# Patient Record
Sex: Male | Born: 1955 | Race: White
Health system: Northeastern US, Academic
[De-identification: ages and names within clinical notes are randomized; demographics above are authoritative.]

## PROBLEM LIST (undated history)

## (undated) DIAGNOSIS — K649 Unspecified hemorrhoids: Secondary | ICD-10-CM

## (undated) DIAGNOSIS — R519 Other chronic pain: Secondary | ICD-10-CM

## (undated) DIAGNOSIS — K219 Gastro-esophageal reflux disease without esophagitis: Secondary | ICD-10-CM

## (undated) DIAGNOSIS — M519 Unspecified thoracic, thoracolumbar and lumbosacral intervertebral disc disorder: Secondary | ICD-10-CM

## (undated) DIAGNOSIS — G8929 Other chronic pain: Secondary | ICD-10-CM

## (undated) DIAGNOSIS — I10 Essential (primary) hypertension: Secondary | ICD-10-CM

## (undated) HISTORY — PX: LUMBAR LAMINECTOMY: SHX95

## (undated) HISTORY — DX: Gastro-esophageal reflux disease without esophagitis: K21.9

## (undated) HISTORY — DX: Unspecified hemorrhoids: K64.9

## (undated) HISTORY — PX: TONSILLECTOMY: SHX28A

## (undated) HISTORY — DX: Essential (primary) hypertension: I10

## (undated) HISTORY — DX: Other chronic pain: R51.9

---

## 2018-11-19 IMAGING — CT CT THORACIC SPINE WO CONTRAST
3 series · 8 of 14 positions shown, 9 images · non-contrast
Comparison: none

CT examination thoracic spine.
Source images as well as 2-D sagittal coronal reconstruction images are obtained.
There is lucency compatible with fracture lines demonstrated extending horizontally as well as vertically within the cephalad aspect of the L1 vertebral body. There is approximately 10% loss of ventral vertebral body height. There is mild, 2 mm, retropulsion cephalad aspect of T12.
Endplate hypertrophy is demonstrated T7-T8, T8-T9, T9-T10, T10-T11 compatible with degenerative disc disease reactive changes. No fractures demonstrated visualized portions of the ribs.
Visualized portions of lungs are clear and there is no pleural effusion.
Paraspinal musculature appears unremarkable.

[Series 2: t-spine stnd · axial · 0.34mm/px · z∈[-250,-125]mm · 2 of 150 slices shown]
[im 50/150  soft-tissue]
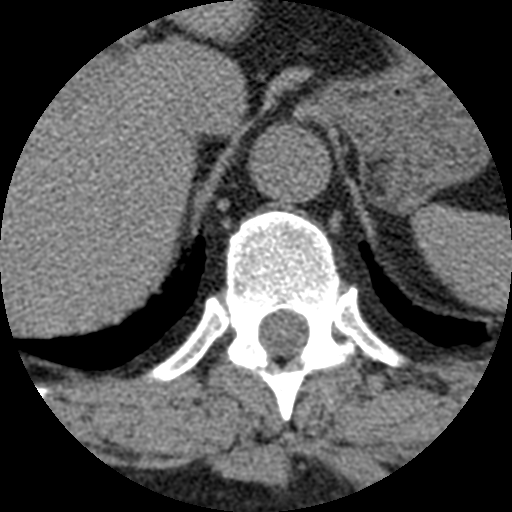
[im 100/150  soft-tissue]
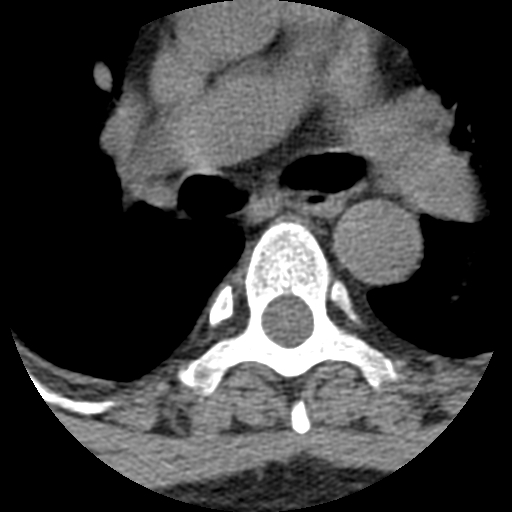

[Series 601: cor 1 · coronal · 0.73mm/px · 3 of 140 slices shown, 4 images]
[im 35/140  soft-tissue]
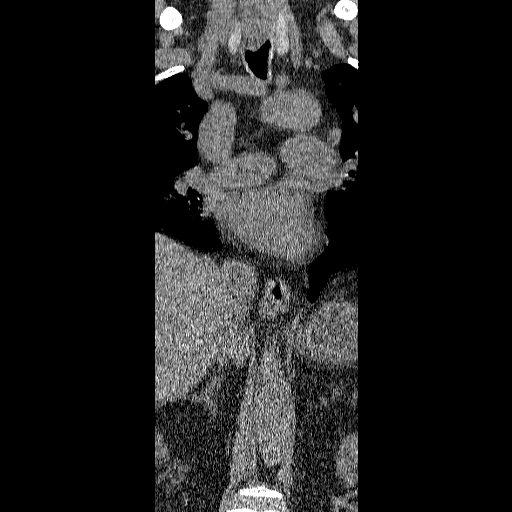
[im 35/140  bone]
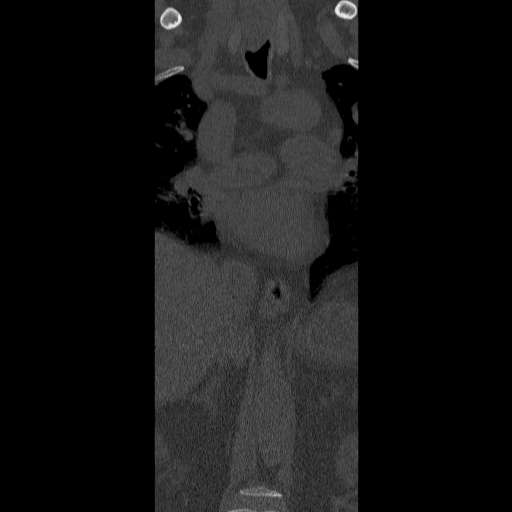
[im 70/140  bone]
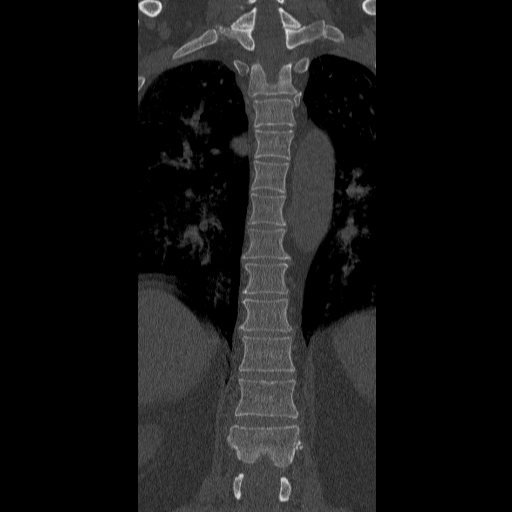
[im 105/140  bone]
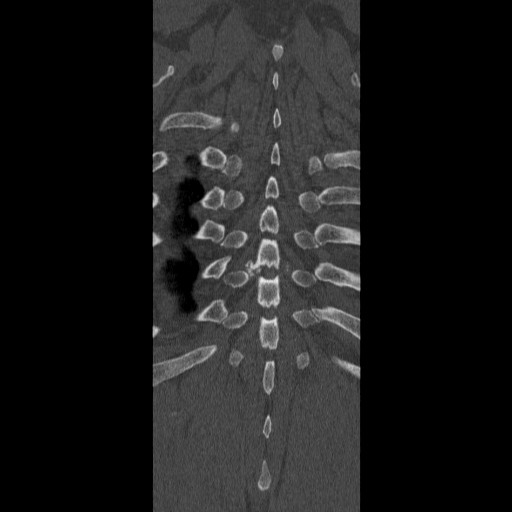

[Series 602: sag 1 · sagittal · 0.73mm/px · 3 of 140 slices shown]
[im 35/140  bone]
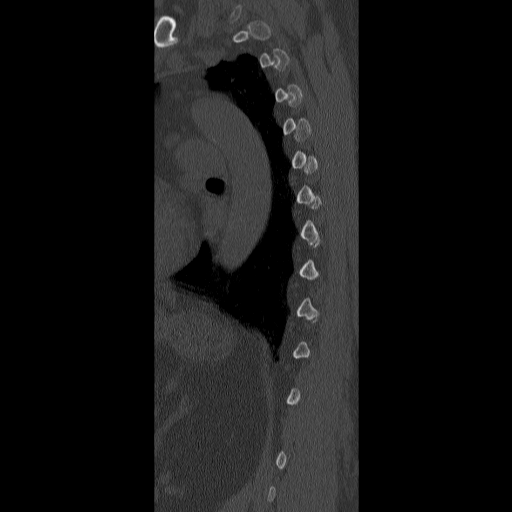
[im 70/140  bone]
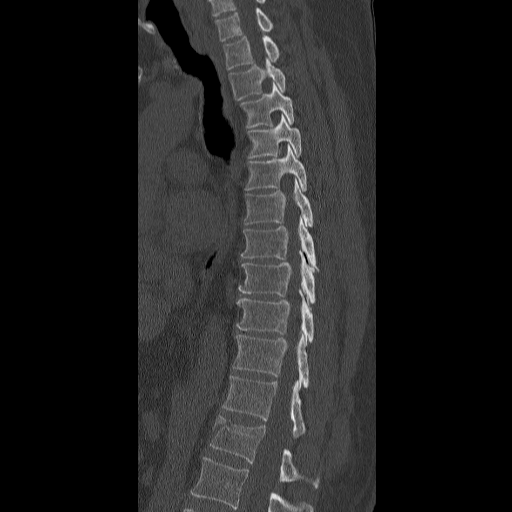
[im 105/140  bone]
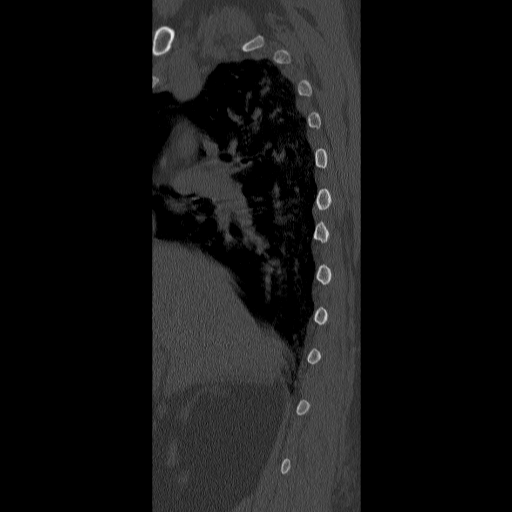

[8 of 14 positions shown; findings below may reference images not displayed]

IMPRESSION: Burst fracture involving cephalad aspect of the L1 vertebral body. There is very mild, 2 mm, retropulsion and no resultant significant canal narrowing and there is approximately 10% loss of vertebral body height.
Location 9

## 2018-11-19 IMAGING — CT CT CERVICAL SPINE WO CONTRAST
2 of 3 series · 8 of 14 positions shown, 9 images · non-contrast
Comparison: none

CT cervical spine examination.
Source images as well as 2-D sagittal coronal reconstruction images are obtained.
There is some mild diffuse kyphotic curvature of the C2-C7 vertebral body levels. Vertebral body heights are maintained. There is endplate hypertrophy at the C4-C5 C5-C6 and C6-C7. There is no fracture demonstrated of cervical vertebral bodies, or posterior elements. A supracondylar as are intact. Visualized lung apices are clear. Visualized portions are ribs appear intact. Prevertebral soft tissues are normal in thickness. Airway shows normal contour normal caliber.

[Series 601: cor thins · coronal · 0.38mm/px · 4 of 194 slices shown, 5 images]
[im 39/194  soft-tissue]
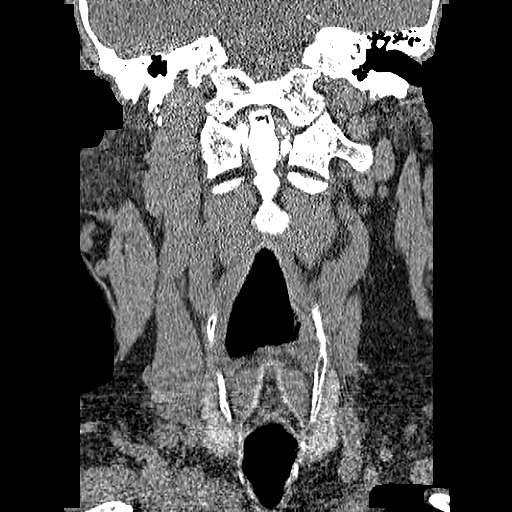
[im 39/194  bone]
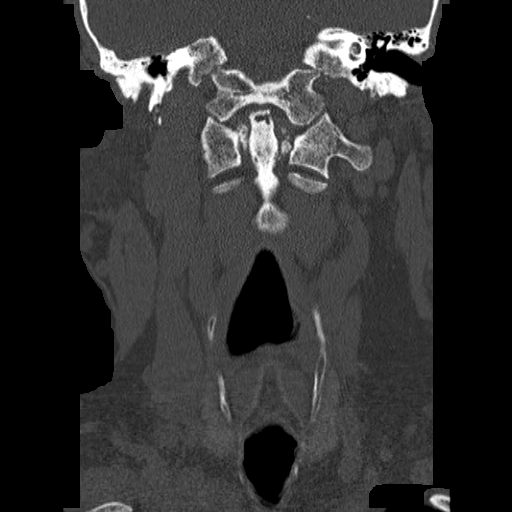
[im 78/194  bone]
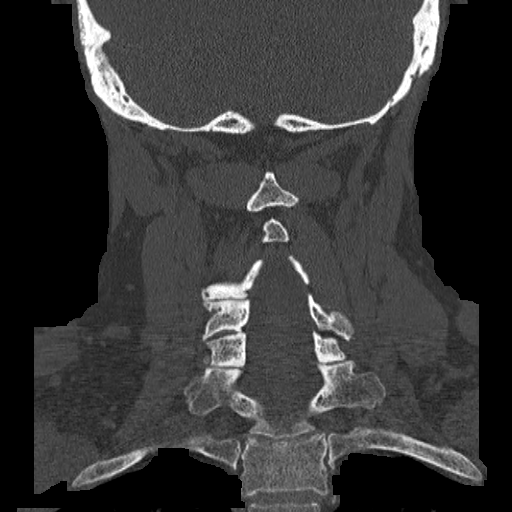
[im 116/194  bone]
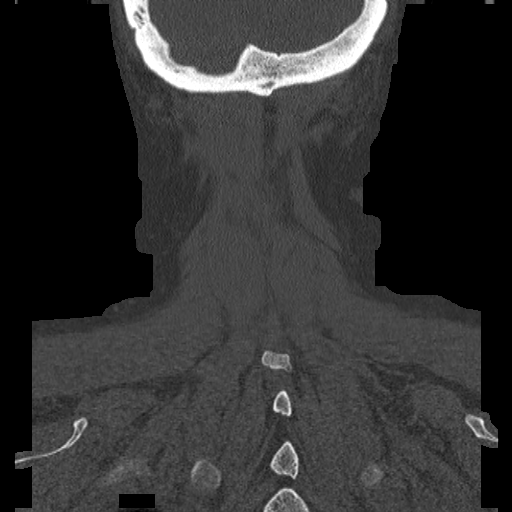
[im 155/194  bone]
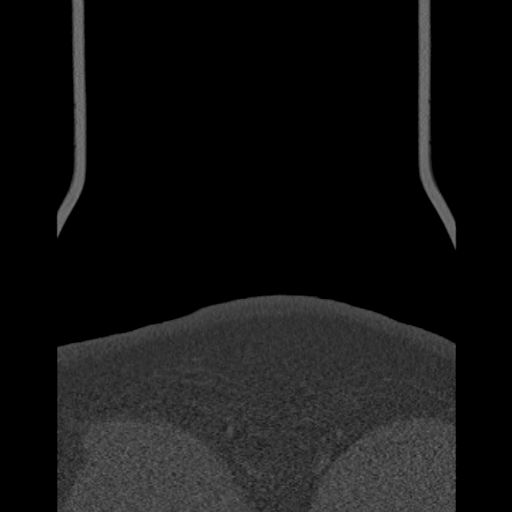

[Series 602: sag thins · sagittal · 0.38mm/px · 4 of 195 slices shown]
[im 39/195  bone]
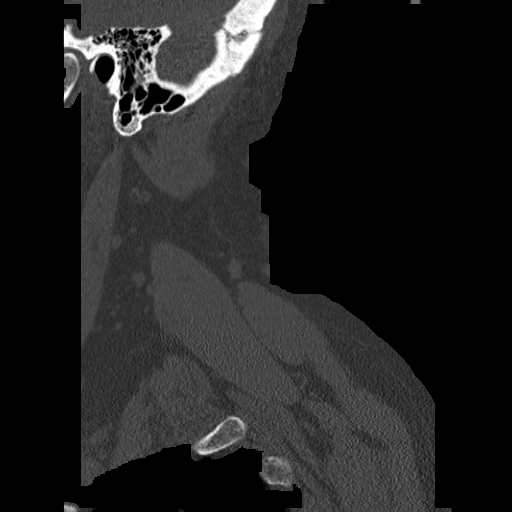
[im 78/195  bone]
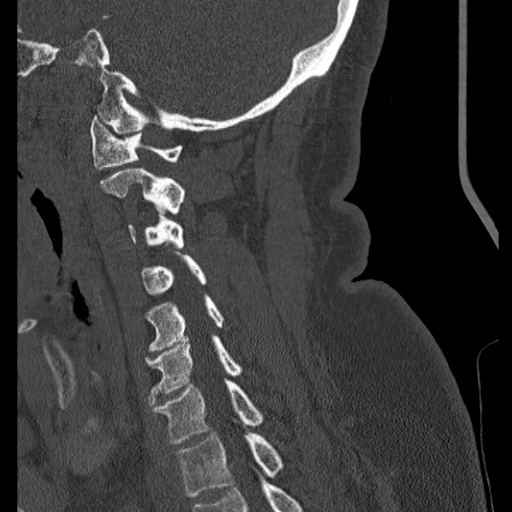
[im 117/195  bone]
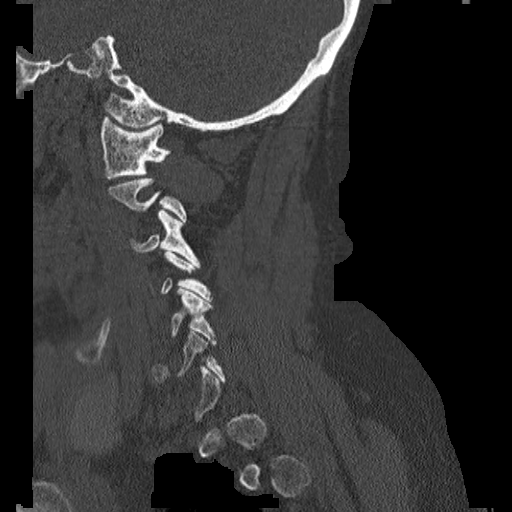
[im 156/195  bone]
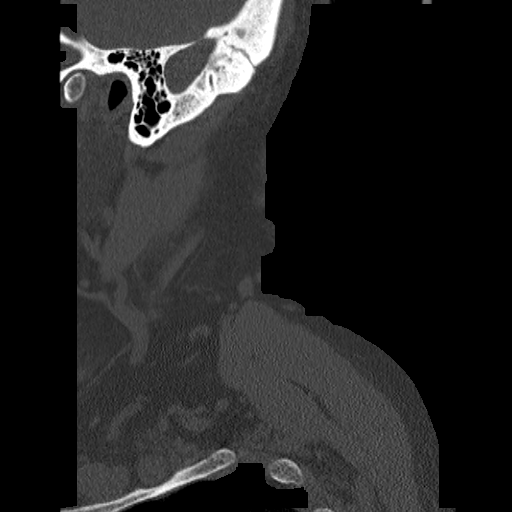

[8 of 14 positions shown; findings below may reference images not displayed]

IMPRESSION: Degenerative disc disease C4-C5 and C5-C6.
No acute osseous injury is demonstrated of cervical spine.
Mild diffuse kyphotic curvature which may represent muscle spasm.
Location 9.

## 2020-08-13 ENCOUNTER — Encounter: Payer: Self-pay | Admitting: Plastic Surgery

## 2020-08-13 ENCOUNTER — Inpatient Hospital Stay
Admission: EM | Admit: 2020-08-13 | Discharge: 2020-08-15 | DRG: 311 | Disposition: A | Discharge status: Home / Self Care | Payer: Medicare Other | Source: Ambulatory Visit | Attending: Emergency Medicine | Admitting: Emergency Medicine

## 2020-08-13 ENCOUNTER — Emergency Department: Payer: Medicare Other

## 2020-08-13 DIAGNOSIS — Z20822 Contact with and (suspected) exposure to covid-19: Secondary | ICD-10-CM | POA: Diagnosis present

## 2020-08-13 DIAGNOSIS — M519 Unspecified thoracic, thoracolumbar and lumbosacral intervertebral disc disorder: Secondary | ICD-10-CM

## 2020-08-13 DIAGNOSIS — R002 Palpitations: Secondary | ICD-10-CM

## 2020-08-13 DIAGNOSIS — R0789 Other chest pain: Secondary | ICD-10-CM

## 2020-08-13 DIAGNOSIS — R918 Other nonspecific abnormal finding of lung field: Secondary | ICD-10-CM

## 2020-08-13 DIAGNOSIS — K5900 Constipation, unspecified: Secondary | ICD-10-CM | POA: Diagnosis present

## 2020-08-13 DIAGNOSIS — K279 Peptic ulcer, site unspecified, unspecified as acute or chronic, without hemorrhage or perforation: Secondary | ICD-10-CM | POA: Diagnosis present

## 2020-08-13 DIAGNOSIS — I160 Hypertensive urgency: Secondary | ICD-10-CM | POA: Diagnosis present

## 2020-08-13 DIAGNOSIS — K219 Gastro-esophageal reflux disease without esophagitis: Secondary | ICD-10-CM | POA: Diagnosis present

## 2020-08-13 DIAGNOSIS — I080 Rheumatic disorders of both mitral and aortic valves: Secondary | ICD-10-CM | POA: Diagnosis present

## 2020-08-13 DIAGNOSIS — Z87891 Personal history of nicotine dependence: Secondary | ICD-10-CM

## 2020-08-13 DIAGNOSIS — E785 Hyperlipidemia, unspecified: Secondary | ICD-10-CM | POA: Diagnosis present

## 2020-08-13 DIAGNOSIS — R9431 Abnormal electrocardiogram [ECG] [EKG]: Secondary | ICD-10-CM | POA: Diagnosis present

## 2020-08-13 DIAGNOSIS — R079 Chest pain, unspecified: Secondary | ICD-10-CM

## 2020-08-13 DIAGNOSIS — I209 Angina pectoris, unspecified: Principal | ICD-10-CM | POA: Diagnosis present

## 2020-08-13 DIAGNOSIS — I10 Essential (primary) hypertension: Secondary | ICD-10-CM | POA: Diagnosis present

## 2020-08-13 HISTORY — DX: Other chronic pain: G89.29

## 2020-08-13 HISTORY — DX: Unspecified thoracic, thoracolumbar and lumbosacral intervertebral disc disorder: M51.9

## 2020-08-13 LAB — RUQ PANEL (ED ONLY)
ALT: 36 U/L (ref 0–50)
ALT: 31 U/L (ref 0–50)
AST: 29 U/L (ref 0–50)
AST: 27 U/L (ref 0–50)
Albumin: 4.5 g/dL (ref 3.5–5.2)
Albumin: 4.6 g/dL (ref 3.5–5.2)
Alk Phos: 78 U/L (ref 40–130)
Alk Phos: 77 U/L (ref 40–130)
Amylase: 40 U/L (ref 28–100)
Amylase: 40 U/L (ref 28–100)
Bilirubin,Direct: <0.2 mg/dL (ref 0.0–0.3)
Bilirubin,Direct: <0.2 mg/dL (ref 0.0–0.3)
Bilirubin,Total: 0.4 mg/dL (ref 0.0–1.2)
Bilirubin,Total: 0.3 mg/dL (ref 0.0–1.2)
Lipase: 39 U/L (ref 13–60)
Lipase: 34 U/L (ref 13–60)
Total Protein: 6.9 g/dL (ref 6.3–7.7)
Total Protein: 6.8 g/dL (ref 6.3–7.7)

## 2020-08-13 LAB — TROPONIN T 3 HR W/ DELTA HIGH SENSITIVITY (IP/ED ONLY)
HS TROP % Change: -20 % — ABNORMAL LOW (ref 0–20)
TROP T 0-3 HR DELTA High Sensitivity: -2 — ABNORMAL LOW (ref 0–11)
TROP T 3 HR High Sensitivity: 8 ng/L (ref 0–21)

## 2020-08-13 LAB — CBC AND DIFFERENTIAL
Baso # K/uL: 0.1 10*3/uL (ref 0.0–0.1)
Baso # K/uL: 0.1 10*3/uL (ref 0.0–0.1)
Basophil %: 1 %
Basophil %: 1 %
Eos # K/uL: 0.3 10*3/uL (ref 0.0–0.5)
Eos # K/uL: 0.3 10*3/uL (ref 0.0–0.5)
Eosinophil %: 4.7 %
Eosinophil %: 4.9 %
Hematocrit: 47 % (ref 40–51)
Hematocrit: 48 % (ref 40–51)
Hemoglobin: 15.8 g/dL (ref 13.7–17.5)
Hemoglobin: 15.6 g/dL (ref 13.7–17.5)
IMM Granulocytes #: 0 10*3/uL (ref 0.0–0.0)
IMM Granulocytes #: 0 10*3/uL (ref 0.0–0.0)
IMM Granulocytes: 0.2 %
IMM Granulocytes: 0.2 %
Lymph # K/uL: 1.8 10*3/uL (ref 1.3–3.6)
Lymph # K/uL: 1.6 10*3/uL (ref 1.3–3.6)
Lymphocyte %: 28.7 %
Lymphocyte %: 27.3 %
MCH: 31 pg (ref 26–32)
MCH: 32 pg (ref 26–32)
MCHC: 33 g/dL (ref 32–37)
MCHC: 33 g/dL (ref 32–37)
MCV: 94 fL — ABNORMAL HIGH (ref 79–92)
MCV: 96 fL — ABNORMAL HIGH (ref 79–92)
Mono # K/uL: 0.5 10*3/uL (ref 0.3–0.8)
Mono # K/uL: 0.5 10*3/uL (ref 0.3–0.8)
Monocyte %: 8.5 %
Monocyte %: 8.1 %
Neut # K/uL: 3.5 10*3/uL (ref 1.8–5.4)
Neut # K/uL: 3.5 10*3/uL (ref 1.8–5.4)
Nucl RBC # K/uL: 0 10*3/uL (ref 0.0–0.0)
Nucl RBC # K/uL: 0 10*3/uL (ref 0.0–0.0)
Nucl RBC %: 0 /100 WBC (ref 0.0–0.2)
Nucl RBC %: 0 /100 WBC (ref 0.0–0.2)
Platelets: 232 10*3/uL (ref 150–330)
Platelets: 228 10*3/uL (ref 150–330)
RBC: 5.1 MIL/uL (ref 4.6–6.1)
RBC: 5 MIL/uL (ref 4.6–6.1)
RDW: 12.6 % (ref 11.6–14.4)
RDW: 12.5 % (ref 11.6–14.4)
Seg Neut %: 56.9 %
Seg Neut %: 58.5 %
WBC: 6.1 10*3/uL (ref 4.2–9.1)
WBC: 6 10*3/uL (ref 4.2–9.1)

## 2020-08-13 LAB — BASIC METABOLIC PANEL
Anion Gap: 14 (ref 7–16)
Anion Gap: 13 (ref 7–16)
CO2: 23 mmol/L (ref 20–28)
CO2: 23 mmol/L (ref 20–28)
Calcium: 9.6 mg/dL (ref 8.6–10.2)
Calcium: 9.2 mg/dL (ref 8.6–10.2)
Chloride: 102 mmol/L (ref 96–108)
Chloride: 105 mmol/L (ref 96–108)
Creatinine: 1.16 mg/dL (ref 0.67–1.17)
Creatinine: 1.18 mg/dL — ABNORMAL HIGH (ref 0.67–1.17)
GFR,Black: 75 *
GFR,Black: 74 *
GFR,Caucasian: 65 *
GFR,Caucasian: 64 *
Glucose: 122 mg/dL — ABNORMAL HIGH (ref 60–99)
Glucose: 122 mg/dL — ABNORMAL HIGH (ref 60–99)
Lab: 23 mg/dL — ABNORMAL HIGH (ref 6–20)
Lab: 24 mg/dL — ABNORMAL HIGH (ref 6–20)
Potassium: 4.6 mmol/L (ref 3.3–5.1)
Potassium: 4 mmol/L (ref 3.3–5.1)
Sodium: 139 mmol/L (ref 133–145)
Sodium: 141 mmol/L (ref 133–145)

## 2020-08-13 LAB — HOLD BLUE

## 2020-08-13 LAB — COVID-19 NAAT (PCR): COVID-19 NAAT (PCR): NEGATIVE

## 2020-08-13 LAB — DATE/TIME NOT PROVIDED

## 2020-08-13 LAB — PERFORMING LAB

## 2020-08-13 LAB — HOLD GREEN NO GEL

## 2020-08-13 LAB — TROPONIN T 0 HR HIGH SENSITIVITY (IP/ED ONLY): TROP T 0 HR High Sensitivity: 10 ng/L (ref 0–21)

## 2020-08-13 LAB — COVID-19 PCR

## 2020-08-13 MED ORDER — PANTOPRAZOLE SODIUM 40 MG PO TBEC *I*
40.0000 mg | DELAYED_RELEASE_TABLET | Freq: Once | ORAL | Status: AC
Start: 2020-08-13 — End: 2020-08-13
  Administered 2020-08-13: 40 mg via ORAL
  Filled 2020-08-13: qty 1

## 2020-08-13 MED ORDER — ASPIRIN 81 MG PO CHEW *I*
324.0000 mg | CHEWABLE_TABLET | Freq: Once | ORAL | Status: AC
Start: 2020-08-13 — End: 2020-08-13
  Administered 2020-08-13: 324 mg via ORAL

## 2020-08-13 MED ORDER — DOCUSATE SODIUM 100 MG PO CAPS *I*
100.0000 mg | ORAL_CAPSULE | Freq: Two times a day (BID) | ORAL | Status: DC
Start: 2020-08-13 — End: 2020-08-15
  Administered 2020-08-13 – 2020-08-15 (×4): 100 mg via ORAL
  Filled 2020-08-13 (×4): qty 1

## 2020-08-13 MED ORDER — ASPIRIN 325 MG PO TBEC *I*
325.0000 mg | DELAYED_RELEASE_TABLET | Freq: Every day | ORAL | Status: DC
Start: 2020-08-14 — End: 2020-08-15
  Administered 2020-08-14 – 2020-08-15 (×2): 325 mg via ORAL
  Filled 2020-08-13 (×2): qty 1

## 2020-08-13 MED ORDER — ASPIRIN 325 MG PO TABS *I*
ORAL_TABLET | ORAL | Status: DC
Start: 2020-08-13 — End: 2020-08-13
  Filled 2020-08-13: qty 1

## 2020-08-13 MED ORDER — SODIUM CHLORIDE 0.9 % FLUSH FOR PUMPS *I*
0.0000 mL/h | INTRAVENOUS | Status: DC | PRN
Start: 2020-08-13 — End: 2020-08-15

## 2020-08-13 MED ORDER — ACETAMINOPHEN 500 MG PO TABS *I*
1000.0000 mg | ORAL_TABLET | Freq: Four times a day (QID) | ORAL | Status: DC | PRN
Start: 2020-08-13 — End: 2020-08-15
  Administered 2020-08-15: 1000 mg via ORAL
  Filled 2020-08-13: qty 2

## 2020-08-13 MED ORDER — FAMOTIDINE 20 MG PO TABS *I*
20.0000 mg | ORAL_TABLET | Freq: Once | ORAL | Status: AC
Start: 2020-08-13 — End: 2020-08-13
  Administered 2020-08-13: 20 mg via ORAL
  Filled 2020-08-13: qty 1

## 2020-08-13 NOTE — First Provider Contact (Signed)
ED First Provider Contact Note    Patient with chest pain, radiating to back wit indigestion, pain comes and goes, exertions make pain symptoms worse.  Also, brother in law is paraplegic, pushes his up/down driveway, last did this 2-3 weeks ago, denies injury  Vital signs reviewed.    Assessment: CP/back pain    Orders placed:  EKG, LABS, XRAYS and saline lock, telemetry     Patient requires further evaluation.     Vienne Corcoran ANN Clint, NP, 08/13/2020, 11:00 AM     Gale Journey, Audrea Muscat, NP  08/13/20 1103

## 2020-08-13 NOTE — ED Notes (Signed)
Plan of Care     Meds per [facility]

## 2020-08-13 NOTE — ED Notes (Signed)
Plan of Care     VS, safety and comfort measures, NPO, stress echo, tele, cards

## 2020-08-13 NOTE — ED Triage Notes (Signed)
Chest pain that radiates to the back for 3 days. Pain intermittent. Worse on exertion. No Cardiac Hx. EKG in triage. Hypertensive on arrival.             Prehospital medications given: No

## 2020-08-13 NOTE — ED Notes (Addendum)
Assumed care and assessed ATT. Resting comfortably in NAD. Pt denies CP, SOB, dizziness, nausea, palpitations. In NSR/sinus brady on tele. Reviewed POC, call bell within reach.

## 2020-08-13 NOTE — ED Provider Notes (Addendum)
History     Chief Complaint   Patient presents with    Chest Pain     Nicholas Page is a 64 yo M with minimal health care presenting with acute on chronic chest pain. He describes the pain as substernal pressure, somewhere between sharp and dull, that radiates to the back. He has had this chest pain for several years, but previously it was only with exertion. Over time, it presented with less and less exertion. For the past four days, he notes the chest pain has been constant. This has been associated with indigestion for the past week that has been worse than the indigestion he has had previously. He quit smoking in April, but has a 60 pack year smoking history. He denies pain radiating to the neck, tearing pain, diaphoresis, shortness of breath, orthopnea. He endorses occasional palpitations. He reports not seeing a doctor in 30 years because he has had poor health insurance coverage.     He denies any medical problems and takes only exedrin migraine for overall aches/pains and a stool softener. He endorses prior surgeries in his back.          Medical/Surgical/Family History     No past medical history on file.     There is no problem list on file for this patient.           No past surgical history on file.  No family history on file.       Social History     Tobacco Use    Smoking status: Not on file    Smokeless tobacco: Not on file   Substance Use Topics    Alcohol use: Not on file    Drug use: Not on file     Living Situation     Questions Responses    Patient lives with     Homeless     Caregiver for other family member     External Services     Employment     Domestic Violence Risk                 Review of Systems   Review of Systems   Constitutional: Negative for chills, diaphoresis and fever.   HENT: Negative for nosebleeds.    Eyes: Negative for pain.   Respiratory: Negative for cough and shortness of breath.    Cardiovascular: Positive for chest pain and palpitations.   Gastrointestinal:  Negative for abdominal pain, blood in stool, nausea and vomiting.   Genitourinary: Negative for hematuria.   Musculoskeletal: Negative for arthralgias.   Skin: Negative for rash.   Neurological: Negative for dizziness and headaches.   Hematological: Does not bruise/bleed easily.       Physical Exam     Triage Vitals  Triage Start: Start, (08/13/20 1056)   First Recorded BP: (!) 202/99, Resp: 18, Temp: 36.3 C (97.3 F) Oxygen Therapy SpO2: 98 %, O2 Device: None (Room air), Heart Rate: 70, (08/13/20 1056)  .      Physical Exam  Constitutional:       General: He is not in acute distress.     Appearance: He is not ill-appearing or diaphoretic.   HENT:      Head: Atraumatic.   Eyes:      Extraocular Movements: Extraocular movements intact.   Cardiovascular:      Rate and Rhythm: Normal rate.      Heart sounds: Murmur heard.    Systolic murmur is present with a  grade of 3/6.      Pulmonary:      Effort: Pulmonary effort is normal.      Breath sounds: Normal breath sounds.   Abdominal:      General: Bowel sounds are normal.      Palpations: Abdomen is soft.   Musculoskeletal:      Right lower leg: No edema.      Left lower leg: 1+ Edema present.   Skin:     General: Skin is warm and dry.   Neurological:      Mental Status: He is alert and oriented to person, place, and time.         Medical Decision Making     Assessment:  Nicholas Page is a  64 yo M presenting with acute worsening of chronic, exertional chest pain in the setting of hypertensive urgency and no health care contact for the past thirty years. Exam is notable for systolic murmur, likely aortic stenosis. EKG is reassuring for no acute or chronic changes. His presentation is most concerning for ACS, unstable angina v. NSTEMI. His back pain and hypertension are concerning for aortic dissection, although his description of symptom onset ( > 3 years with escalating sx) makes this much less likely.    Differential diagnosis:  Unstable angina, NSTEMI, aortic  dissection, esophageal spasm    Plan:  ASA 324mg   Troponin  CBC, BMP, RUQ panel  CXR  COVID PCR      EKG Interpretation: normal sinus rhythm, no ischemic changes    ED Course and Disposition:  Ed Steichen's CXR did not show mediastinal widening. Labs were reviewed and notable for non elevated 0 hour troponin at 10. He was admitted to ED observation for further observation and workup.             Ranelle Oyster, MS4      Med Student Attestation:    The medical student was personally supervised by me and/or my resident during the patient examination on 08/13/2020. I personally saw and evaluated the patient, provided the medical decision-making, and reviewed and verified the key elements of the student documentation. I have edited the medical student's note and confirm the findings and plan of care as documented.      Author:  Nicholaus Corolla, MD,PhD       Ranelle Oyster  08/13/20 [place], Laureano Hetzer, MD,PhD  08/14/20 1431

## 2020-08-13 NOTE — ED Notes (Signed)
Patient arrived to unit via stretcher. Patient ambulated to bed independently. Pt is Alert/Oriented x4. Pt oriented to unit and call bell. Pt made aware of observation level of care. Pt denies any SOB, CP, nausea, or dizziness at this time.

## 2020-08-13 NOTE — ED Obs Notes (Addendum)
ED OBSERVATION ADMISSION NOTE    Patient seen by me today, 08/13/2020 at 3:04 PM    Current patient status: Observation    History     Chief Complaint   Patient presents with    Chest Pain     Pt with a PMH of lumbar disc disease s/p lumbar laminectomy x 2, constipation and issues with headaches for which he takes about 3 Excedrin Migraine daily who presents with issues of chest and epigastric pain that has been an issue for about 3 yrs. Initially he would note this pain whenever he was active and he was ok at rest. More recently, particularly over the past weekend he has noted that the pain will occur at rest  As well as with activity. He notes the pain as being burning in nature with radiation into his back. He notes "indigestion" with it - in the form of burping a lot. He denies any nausea or shortness of breath. The pain is made worse with physical activity. Nothing in particular seems to relieve it though the indigestion gets better with adjustment of diet.     He denies any PMH of CAD. He has not seen a provider regularly in about 20 to 30 yrs so unk as to if he has baseline HTN or HLD. He was a heavy smoker for years but stopped in the spring of this year. He does drink about one beer / night and as noted above takes about 3 excedrin migraine per day for headaches and it also helps with his DJD. He does not use any type of street drugs. He has never had any evaluation by GI including never having had a screening colonoscopy.       History provided by:  Patient  Language interpreter used: No        Past Medical History:   Diagnosis Date    Chronic headaches     Lumbar disc disease        Past Surgical History:   Procedure Laterality Date    LUMBAR LAMINECTOMY      TONSILLECTOMY         Family History   Problem Relation Age of Onset    Cancer Sister        Social History      reports that he quit smoking about 7 months ago. He has never used smokeless tobacco. He reports current alcohol use of about 7.0  standard drinks of alcohol per week. He reports that he does not use drugs. No history on file for sexual activity.    Living Situation     Questions Responses    Patient lives with Family    Homeless No    Caregiver for other family member No    External Services     Employment Retired    Domestic Violence Risk No          Review of Systems   Review of Systems   Constitutional: Negative for activity change.   HENT: Negative for trouble swallowing.    Eyes: Negative for visual disturbance (chronic asigmatism ).   Respiratory: Negative for shortness of breath.    Cardiovascular: Positive for chest pain.   Gastrointestinal: Positive for constipation. Negative for nausea.   Endocrine: Negative for polyuria.   Genitourinary: Negative for difficulty urinating.   Musculoskeletal: Positive for arthralgias.   Skin: Negative for rash.   Allergic/Immunologic: Negative for immunocompromised state.   Neurological: Positive for weakness (chronically in left foot  /  callf from pervious lumbar surgery ).       Physical Exam   BP (!) 202/99    Pulse 70    Temp 36.3 C (97.3 F)    Resp 18    Ht 1.803 m (_0 )    Wt 120.2 kg (265 lb)    SpO2 98%    BMI 36.96 kg/m     Physical Exam  Vitals and nursing note reviewed.   Constitutional:       Appearance: He is well-developed.   HENT:      Head: Normocephalic.   Cardiovascular:      Rate and Rhythm: Normal rate.      Heart sounds:    No systolic murmur is present.   No diastolic murmur is present.      Pulmonary:      Breath sounds: Normal breath sounds. No decreased breath sounds, wheezing, rhonchi or rales.   Abdominal:      Palpations: Abdomen is soft.      Tenderness: There is no abdominal tenderness.   Musculoskeletal:      Right lower leg: No edema.      Left lower leg: No edema.      Comments: Chronic atrophy in left calf accompanied by weakness in left foot plantarflexion which pt reports is from his previous lumbar surgery with subsequent nerve damage.    Skin:     General:  Skin is warm.   Neurological:      Mental Status: He is alert and oriented to person, place, and time.   Psychiatric:         Mood and Affect: Mood normal. Mood is not anxious.         Behavior: Behavior normal. Behavior is not agitated.         Tests    EKG: sinus rate 67 / no acute findings   Labs:   All labs in the last 24 hours:   Recent Results (from the past 24 hour(s))   Troponin T 0 HR High Sensitivity    Collection Time: 08/13/20  1:08 PM   Result Value Ref Range    TROP T 0 HR High Sensitivity 10 0 - 21 ng/L   RUQ panel (ED only)    Collection Time: 08/13/20  1:08 PM   Result Value Ref Range    Amylase 40 28 - 100 U/L    Lipase 39 13 - 60 U/L    Total Protein 6.9 6.3 - 7.7 g/dL    Albumin 4.5 3.5 - 5.2 g/dL    Bilirubin,Total 0.4 0.0 - 1.2 mg/dL    Bili,Indirect see below 0.1 - 1.0 mg/dL    Bilirubin,Direct <0.2 0.0 - 0.3 mg/dL    Alk Phos 78 40 - 130 U/L    AST 29 0 - 50 U/L    ALT 36 0 - 50 U/L   CBC and differential    Collection Time: 08/13/20  1:08 PM   Result Value Ref Range    WBC 6.1 4.2 - 9.1 THOU/uL    RBC 5.1 4.6 - 6.1 MIL/uL    Hemoglobin 15.8 13.7 - 17.5 g/dL    Hematocrit 47 40 - 51 %    MCV 94 (H) 79 - 92 fL    MCH 31 26 - 32 pg    MCHC 33 32 - 37 g/dL    RDW 12.6 11.6 - 14.4 %    Platelets 232 150 - 330 THOU/uL  Seg Neut % 56.9 %    Lymphocyte % 28.7 %    Monocyte % 8.5 %    Eosinophil % 4.7 %    Basophil % 1.0 %    Neut # K/uL 3.5 1.8 - 5.4 THOU/uL    Lymph # K/uL 1.8 1.3 - 3.6 THOU/uL    Mono # K/uL 0.5 0.3 - 0.8 THOU/uL    Eos # K/uL 0.3 0.0 - 0.5 THOU/uL    Baso # K/uL 0.1 0.0 - 0.1 THOU/uL    Nucl RBC % 0.0 0.0 - 0.2 /100 WBC    Nucl RBC # K/uL 0.0 0.0 - 0.0 THOU/uL    IMM Granulocytes # 0.0 0.0 - 0.0 THOU/uL    IMM Granulocytes 0.2 %   Basic metabolic panel    Collection Time: 08/13/20  1:08 PM   Result Value Ref Range    Glucose 122 (H) 60 - 99 mg/dL    Sodium 139 133 - 145 mmol/L    Potassium 4.6 3.3 - 5.1 mmol/L    Chloride 102 96 - 108 mmol/L    CO2 23 20 - 28 mmol/L    Anion  Gap 14 7 - 16    UN 23 (H) 6 - 20 mg/dL    Creatinine 1.16 0.67 - 1.17 mg/dL    GFR,Caucasian 65 *    GFR,Black 75 *    Calcium 9.6 8.6 - 10.2 mg/dL   Hold blue    Collection Time: 08/13/20  1:08 PM   Result Value Ref Range    Hold Blue HOLD TUBE    COVID-19 PCR    Collection Time: 08/13/20  2:02 PM   Result Value Ref Range    COVID-19 Source Nasopharyngeal     COVID-19 PCR NEG NEG   Date/time not provided    Collection Time: 08/13/20  2:02 PM   Result Value Ref Range    Date/Time Not Provided see below    Performing Lab    Collection Time: 08/13/20  2:02 PM   Result Value Ref Range    Performing Lab see below         Imaging:  CXR :   NAD       Medical Decision Making      Amount and/or Complexity of Data Reviewed  Clinical lab tests: ordered and reviewed  Tests in the radiology section of CPT: reviewed  Tests in the medicine section of CPT: reviewed  Discuss the patient with other providers: yes        Assessment:    64 y.o., male placed in OBS after evaluation in the ED for  issues of chest and epigastric pain that has been an issue for about 3 yrs. Initially he would note this pain whenever he was active and he was ok at rest. More recently, particularly over the past weekend he has noted that the pain will occur at rest  As well as with activity. He notes the pain as being burning in nature with radiation into his back. He notes "indigestion" with it - in the form of burping a lot. He denies any nausea or shortness of breath. The pain is made worse with physical activity. Nothing in particular seems to relieve it though the indigestion gets better with adjustment of diet.     Differential Diagnosis includes ACS, GERD, PUD, COPD,bronchospasm, esophogeal spasm, gastritis, MSK pain, deconditioning, PN                Plan:  1. Chest pain / etiology unclear   -serial troponins   -start on PPI  Given daily beer and TID ASA / caffeine   -check lipid profile in AM   -Stress echo in AM if trops adynamic   -daily  ASA 367m PO     2. Constipation   -daily colace     3. Hx of tobaccoism / now quit   - pt is set with new PCP and can have PFT's done as outpatient   -CXR NAD     4. Elevated BP in ED  -BP improved in obs without intervention and no recent serial recordings of BP as outpatient. Hold on any new medications at this point.           Medically preferred DVT prophylaxis: None  Smoking Cessation: NA  Code Status: Full   Disposition Barriers: none anticipated   Covid-19 Status: neg     TTrans[place]aigne PA     CVinie Sill [place] 08/13/20 [place] 08/13/20 1806

## 2020-08-14 ENCOUNTER — Observation Stay: Payer: Medicare Other

## 2020-08-14 DIAGNOSIS — R9439 Abnormal result of other cardiovascular function study: Secondary | ICD-10-CM

## 2020-08-14 DIAGNOSIS — R079 Chest pain, unspecified: Secondary | ICD-10-CM

## 2020-08-14 LAB — EXERCISE STRESS ECHO COMPLETE
AR CWD Gradient (peak): 90.1 mmHg
AR Velocity (peak): 474.6 cm/s
AV Area (LV SV Mtd): 0.75 cm2
AV Area (LV SV) BSA Index: 0.31 cm2/m2
AV CWD VTI: 56.1 cm
AV CWD Velocity (Mean): 172.5 cm/s
AV CWD Velocity (Peak): 240.6 cm/s
AV Gradient (mean): 11.1 mmHg
AV Gradient (peak): 19.6 mmHg
Aortic Diameter (sinus of Valsalva): 3.3 cm
BMI: 37 kg/m2
BP Diastolic: 85 mmHg
BP Systolic: 164 mmHg
BSA: 2.45 m2
Deceleration Time - AR: 2070.1 ms
ECG PR interval: 180 ms
ECG QRS interval: 120 ms
Echo RV Stroke Work Index Estimate: 409.7 mmHg•mL/m2
Estimated workload: 5 METS
Heart Rate: 55 {beats}/min
Height: 71 in
LA Diameter BSA Index: 1.6 cm/m2
LA Diameter Height Index: 2.1 cm/m
LA Diameter: 3.8 cm
LV ASE Mass BSA Index: 43.1 gm/m2
LV ASE Mass Height 2.7 Index: 21.5 gm/m2.7
LV ASE Mass Height Index: 58.5 gm/m
LV ASE Mass: 105.6 gm
LV CO BSA Index: 0.94 L/min/m2
LV Cardiac Output: 2.31 L/min
LV Diastolic Volume Index: 30.2 mL/m2
LV Posterior Wall Thickness: 0.77 cm
LV SV BSA Index: 17.1 mL/m2
LV SV Height Index: 23.3 mL/m
LV Septal Thickness: 0.72 cm
LV Stroke Volume: 42 mL
LV Systolic Volume Index: 13.1 mL/m2
LV wall/cavity ratio: 0.33
LVED Diameter BSA Index: 1.86 cm/m2
LVED Diameter Height Index: 2.52 cm/m
LVED Diameter: 4.55 cm
LVED Volume BSA Index: 30 ml/m2
LVED Volume BSA Index: 30.2 mL/m2
LVED Volume Height Index: 41 mL/m
LVED Volume: 74 mL
LVEF (Volume): 57 %
LVES Volume BSA Index: 13 ml/m2
LVES Volume BSA Index: 13.1 mL/m2
LVES Volume Height Index: 17.7 mL/m
LVES Volume: 32 mL
LVOT + AV Gradient (mean): 13 mmHg
LVOT + AV Gradient (peak): 23.2 mmHg
LVOT PWD VTI: 20.24 cm
LVOT PWD Velocity (mean): 69.1 cm/s
LVOT PWD Velocity (peak): 94.8 cm/s
LVOT/AV Velocity Ratio: 0.39
MPHR: 155 {beats}/min
Peak DBP: 92 mmHg
Peak Gradient - TR: 23.9 mmHg
Peak HR: 126 {beats}/min
Peak SBP: 172 mmHg
Peak Velocity - TR: 241.98 cm/s
Percent MPHR: 81.3 %
Pressure Half-Time - AR: 600.3 ms
Pulmonary Vascular Resistance Estimate: 10.3 mmHg
RA Pressure Estimate: 10 mmHg
RPP: 21672 BPM x mmHG
RR Interval: 1090.91 ms
RV Peak Systolic Pressure: 33.9 mmHg
Stress Peak Stage: 1
Stress duration (min): 3 min
Stress duration (sec): 0 s
Weight (lbs): 265 [lb_av]
Weight: 4240 oz

## 2020-08-14 LAB — HEMOGLOBIN A1C: Hemoglobin A1C: 5.6 %

## 2020-08-14 LAB — LIPID PANEL
Chol/HDL Ratio: 7.9
Cholesterol: 205 mg/dL — AB
HDL: 26 mg/dL — ABNORMAL LOW (ref 40–60)
Non HDL Cholesterol: 179 mg/dL
Triglycerides: 561 mg/dL — AB

## 2020-08-14 LAB — LDL CHOLESTEROL, DIRECT: LDL Direct: 92 mg/dL

## 2020-08-14 MED ORDER — PERFLUTREN PROTEIN A MICROSPH (OPTISON) IV SUSP *I*
1.5000 mL | INTRAVENOUS | Status: AC | PRN
Start: 2020-08-14 — End: 2020-08-14
  Administered 2020-08-14 (×2): 1.5 mL via INTRAVENOUS

## 2020-08-14 MED ORDER — ALUM & MAG HYDROXIDE-SIMETH 200-200-20 MG/5ML PO SUSP *I*
30.0000 mL | ORAL | Status: DC | PRN
Start: 2020-08-14 — End: 2020-08-15

## 2020-08-14 MED ORDER — CALCIUM CARBONATE ANTACID 500 MG PO CHEW *I*
500.0000 mg | CHEWABLE_TABLET | Freq: Two times a day (BID) | ORAL | Status: DC | PRN
Start: 2020-08-14 — End: 2020-08-15
  Administered 2020-08-14: 500 mg via ORAL
  Filled 2020-08-14: qty 1

## 2020-08-14 MED ORDER — PANTOPRAZOLE SODIUM 40 MG PO TBEC *I*
40.0000 mg | DELAYED_RELEASE_TABLET | Freq: Every morning | ORAL | Status: DC
Start: 2020-08-14 — End: 2020-08-15
  Administered 2020-08-14 – 2020-08-15 (×2): 40 mg via ORAL
  Filled 2020-08-14 (×2): qty 1

## 2020-08-14 NOTE — ED Obs Notes (Addendum)
ED OBSERVATION PROGRESS NOTE     Patient seen by me today, 08/14/2020 at 07:30am.    Current patient status: Observation    Chief Complaint:   Chief Complaint   Patient presents with    Chest Pain       Subjective:  Patient states he continues to have midsternal burning pain at rest w/o change based on position, exertion, or inspiration. No other complaints.     Nursing Pain Score:  Last Nursing documented pain:        Vitals: Reviewed  Patient Vitals for the past 24 hrs:   BP Temp Temp src Pulse Resp SpO2 Height Weight   08/14/20 0557 141/75 36.2 C (97.2 F) TEMPORAL 57 16 97 % -- --   08/14/20 0031 145/79 36.3 C (97.4 F) TEMPORAL 59 16 98 % -- --   08/13/20 2126 144/78 36.2 C (97.2 F) TEMPORAL 59 16 98 % -- --   08/13/20 1743 165/79 35.9 C (96.6 F) TEMPORAL 60 16 99 % -- --   08/13/20 1102 -- -- -- -- -- -- 1.803 m (_0 ) 120.2 kg (265 lb)   08/13/20 1056 (!) 202/99 36.3 C (97.3 F) -- 70 18 98 % -- --       Physical Examination:  Physical Exam  Vitals and nursing note reviewed.   Constitutional:       General: He is not in acute distress.     Appearance: He is well-developed. He is not ill-appearing, toxic-appearing or diaphoretic.   HENT:      Head: Normocephalic and atraumatic.   Neck:      Vascular: No JVD.   Cardiovascular:      Rate and Rhythm: Normal rate and regular rhythm.      Pulses:           Radial pulses are 2+ on the right side and 2+ on the left side.      Heart sounds: Murmur heard.    Systolic (Loudest at RUSB) murmur is present with a grade of 3/6.      Pulmonary:      Effort: Pulmonary effort is normal.      Breath sounds: Normal breath sounds.   Chest:      Chest wall: No tenderness.   Abdominal:      Palpations: Abdomen is soft.      Tenderness: There is no abdominal tenderness. There is no guarding.   Musculoskeletal:      Right lower leg: No tenderness.      Left lower leg: No tenderness.   Skin:     General: Skin is warm.      Capillary Refill: Capillary refill takes less than 2  seconds.   Neurological:      Mental Status: He is alert and oriented to person, place, and time.   Psychiatric:         Mood and Affect: Mood normal.         Behavior: Behavior normal.         EKG: NSR normal intervals and axis w/o ischemia    Lab Results:   All labs in the last 24 hours:   Recent Results (from the past 24 hour(s))   Troponin T 0 HR High Sensitivity    Collection Time: 08/13/20  1:08 PM   Result Value Ref Range    TROP T 0 HR High Sensitivity 10 0 - 21 ng/L   RUQ panel (ED only)    Collection Time:  08/13/20  1:08 PM   Result Value Ref Range    Amylase 40 28 - 100 U/L    Lipase 39 13 - 60 U/L    Total Protein 6.9 6.3 - 7.7 g/dL    Albumin 4.5 3.5 - 5.2 g/dL    Bilirubin,Total 0.4 0.0 - 1.2 mg/dL    Bili,Indirect see below 0.1 - 1.0 mg/dL    Bilirubin,Direct <0.2 0.0 - 0.3 mg/dL    Alk Phos 78 40 - 130 U/L    AST 29 0 - 50 U/L    ALT 36 0 - 50 U/L   CBC and differential    Collection Time: 08/13/20  1:08 PM   Result Value Ref Range    WBC 6.1 4.2 - 9.1 THOU/uL    RBC 5.1 4.6 - 6.1 MIL/uL    Hemoglobin 15.8 13.7 - 17.5 g/dL    Hematocrit 47 40 - 51 %    MCV 94 (H) 79 - 92 fL    MCH 31 26 - 32 pg    MCHC 33 32 - 37 g/dL    RDW 12.6 11.6 - 14.4 %    Platelets 232 150 - 330 THOU/uL    Seg Neut % 56.9 %    Lymphocyte % 28.7 %    Monocyte % 8.5 %    Eosinophil % 4.7 %    Basophil % 1.0 %    Neut # K/uL 3.5 1.8 - 5.4 THOU/uL    Lymph # K/uL 1.8 1.3 - 3.6 THOU/uL    Mono # K/uL 0.5 0.3 - 0.8 THOU/uL    Eos # K/uL 0.3 0.0 - 0.5 THOU/uL    Baso # K/uL 0.1 0.0 - 0.1 THOU/uL    Nucl RBC % 0.0 0.0 - 0.2 /100 WBC    Nucl RBC # K/uL 0.0 0.0 - 0.0 THOU/uL    IMM Granulocytes # 0.0 0.0 - 0.0 THOU/uL    IMM Granulocytes 0.2 %   Basic metabolic panel    Collection Time: 08/13/20  1:08 PM   Result Value Ref Range    Glucose 122 (H) 60 - 99 mg/dL    Sodium 139 133 - 145 mmol/L    Potassium 4.6 3.3 - 5.1 mmol/L    Chloride 102 96 - 108 mmol/L    CO2 23 20 - 28 mmol/L    Anion Gap 14 7 - 16    UN 23 (H) 6 - 20 mg/dL     Creatinine 1.16 0.67 - 1.17 mg/dL    GFR,Caucasian 65 *    GFR,Black 75 *    Calcium 9.6 8.6 - 10.2 mg/dL   Hold blue    Collection Time: 08/13/20  1:08 PM   Result Value Ref Range    Hold Blue HOLD TUBE    COVID-19 PCR    Collection Time: 08/13/20  2:02 PM   Result Value Ref Range    COVID-19 Source Nasopharyngeal     COVID-19 PCR NEG NEG   Date/time not provided    Collection Time: 08/13/20  2:02 PM   Result Value Ref Range    Date/Time Not Provided see below    Performing Lab    Collection Time: 08/13/20  2:02 PM   Result Value Ref Range    Performing Lab see below    CBC and differential    Collection Time: 08/13/20  5:16 PM   Result Value Ref Range    WBC 6.0 4.2 - 9.1 THOU/uL  RBC 5.0 4.6 - 6.1 MIL/uL    Hemoglobin 15.6 13.7 - 17.5 g/dL    Hematocrit 48 40 - 51 %    MCV 96 (H) 79 - 92 fL    MCH 32 26 - 32 pg    MCHC 33 32 - 37 g/dL    RDW 12.5 11.6 - 14.4 %    Platelets 228 150 - 330 THOU/uL    Seg Neut % 58.5 %    Lymphocyte % 27.3 %    Monocyte % 8.1 %    Eosinophil % 4.9 %    Basophil % 1.0 %    Neut # K/uL 3.5 1.8 - 5.4 THOU/uL    Lymph # K/uL 1.6 1.3 - 3.6 THOU/uL    Mono # K/uL 0.5 0.3 - 0.8 THOU/uL    Eos # K/uL 0.3 0.0 - 0.5 THOU/uL    Baso # K/uL 0.1 0.0 - 0.1 THOU/uL    Nucl RBC % 0.0 0.0 - 0.2 /100 WBC    Nucl RBC # K/uL 0.0 0.0 - 0.0 THOU/uL    IMM Granulocytes # 0.0 0.0 - 0.0 THOU/uL    IMM Granulocytes 0.2 %   Basic metabolic panel    Collection Time: 08/13/20  5:16 PM   Result Value Ref Range    Glucose 122 (H) 60 - 99 mg/dL    Sodium 141 133 - 145 mmol/L    Potassium 4.0 3.3 - 5.1 mmol/L    Chloride 105 96 - 108 mmol/L    CO2 23 20 - 28 mmol/L    Anion Gap 13 7 - 16    UN 24 (H) 6 - 20 mg/dL    Creatinine 1.18 (H) 0.67 - 1.17 mg/dL    GFR,Caucasian 64 *    GFR,Black 74 *    Calcium 9.2 8.6 - 10.2 mg/dL   RUQ panel    Collection Time: 08/13/20  5:16 PM   Result Value Ref Range    Amylase 40 28 - 100 U/L    Lipase 34 13 - 60 U/L    Total Protein 6.8 6.3 - 7.7 g/dL    Albumin 4.6 3.5 - 5.2 g/dL     Bilirubin,Total 0.3 0.0 - 1.2 mg/dL    Bili,Indirect see below 0.1 - 1.0 mg/dL    Bilirubin,Direct <0.2 0.0 - 0.3 mg/dL    Alk Phos 77 40 - 130 U/L    AST 27 0 - 50 U/L    ALT 31 0 - 50 U/L   Hold green no gel    Collection Time: 08/13/20  5:16 PM   Result Value Ref Range    Hold Green (no gel,not spun) HOLD TUBE    Hold blue    Collection Time: 08/13/20  5:17 PM   Result Value Ref Range    Hold Blue HOLD TUBE    Troponin T 3 HR W/ Delta High Sensitivity    Collection Time: 08/13/20  5:17 PM   Result Value Ref Range    TROP T 3 HR High Sensitivity 8 0 - 21 ng/L    TROP T 0-3 HR DELTA High Sensitivity -2 (L) 0 - 11    HS TROP % Change -20 (L) 0 - 20 %       Imaging findings: *Chest standard frontal and lateral views  Result Date: 08/13/2020  No acute cardiopulmonary disease.     Assessment: 64 y/o m pmh of tobacco use disorder 60 pack year history recently quit  placed in obs from the ED for midsternal/epigastric burning pain that has been an issue for around 3 years usually with exertion, though more recently is occurring at rest also. He has not been followed by a PCP regularly over the past 20-30 years. He takes generic excedrin regularly, but not excessively for headaches at home along with stool softeners for constipation, but otherwise denies any medical problems or any other recent complaints. He has a new PCP that he will be able to follow up with. Workup relatively benign, but will appreciate stress echo today especially given likely aortic stenosis. If findings not significant, will likely discharge later today w/outpatient f/u.     4:16 PM  Cardiology consult and recommendations appreciated. Discussed need for NM stress tomorrow w/patient. Will also likely need to start a statin given elevated chol/trigs. NPO at midnight. Avoid caffeine.        Plan:   1. Chest pain - possibly angina, but also consistent w/GERD/PUD  -start on PPI  Given daily beer and TID ASA / caffeine   -lipid panel is  pending  -Pending stress echo today   -daily ASA 324m PO     2. Constipation   -daily colace     3. Hx of tobaccoism / now quit   - pt is set with new PCP and can have PFT's done as outpatient   -CXR NAD     4. Elevated BP in ED  -BP improved in obs without intervention and no recent serial recordings of BP as outpatient. Hold on any new medications at this point. F/u w/PCP likely    Medically preferred DVT prophylaxis: None         Author: BFelicity Pellegrini PA  Note created: 08/14/2020  at: 7:38 AM     BElveria Rising PA  08/14/20 0Chelsea BRoscoe PConstantine 08/14/20 [phone]

## 2020-08-14 NOTE — ED Notes (Signed)
Assessment completed at approximately 1800. At that time, patient denied chest pain, nausea, dizziness, shortness of breath. Telemetry on, call bell within reach. Will continue to monitor.

## 2020-08-14 NOTE — Progress Notes (Addendum)
08/14/20 1624   UM Patient Class Review   Patient Class Review Inpatient   Patient class effective 08/14/20     [facility] RN,  Utilization Management    [phone]    Writer called into patients room and gave verbal notification of upgrade to inpatient level of care. [place] [id number] will be mailed to home address verified.

## 2020-08-14 NOTE — Provider Consult (Addendum)
[facility]     Cardiology Consult Note    Date of Consult: 08/14/2020    Name: Nicholas Page Attending: No att. providers found   DOB: Nov 18, 1955 PCP: Albertine Grates, MD   EMRN: [id number] Primary Cardiologist: None    Admit Date:  08/13/2020     History of Present Illness     I had the pleasure of seeing Nicholas Page in cardiology consult on 08/14/2020. Nicholas Page is an 63 y.o. male who we were asked to see for mildly positive EKG stress test. He has a PMH of lumbar disc disease s/p lumbar laminectomy x2, constipation, headaches, patient has not seen a doctor in 20-30 years. He presented to the ED with chest and epigastric pain for ~3 years.    Patient reports that his pain would be more present when he is active. His first instance of pain was ~3 years ago and distinctly remembers running to try to catch the bus. His chest pain was substernal and burning/pressure. He continued to have occasional chest pain for the next few years. Over the last weekend the pain began to occur with rest and activity. The pain is described as burning/pressure sensation with radiation to the back. He endorses associated SOB and increased belching. Denies associated nausea, vomiting, or diaphoresis. Rest relieves the pain after 5-10 mins however the belching improves with dietary adjustments.     ED course notable for non-elevated and non-dynamic trops 10->8, normal Cr. Lipid panel with LDL of 92, trigs of 561, A1c - 5.6. CXR without acute cardiopulmonary disease. An exercise stress echo was ordered and performed. It demonstrated an LVEF of 57%, mild to moderate aortic stenosis, mildly positive stress EKG for ischemia at 81% of maximum predicted heart rate. However this test was non-diagnostic due to inability to achieve target heart rate.     He currently feels well without any acute complaints. Currently denies chest pain, shortness of breath, or palpitations.    Past Medical and Surgical History     Past Medical History:    Diagnosis Date    Chronic headaches     Lumbar disc disease      Past Surgical History:   Procedure Laterality Date    LUMBAR LAMINECTOMY      TONSILLECTOMY         Medications and Allergies   (Not in a hospital admission)    Current Facility-Administered Medications   Medication Dose Route Frequency    pantoprazole  40 mg Oral QAM    aspirin EC  325 mg Oral Daily    docusate sodium  100 mg Oral BID     He is allergic to ibuprofen.    Social and Family History     Family History   Problem Relation Age of Onset    Cancer Sister      Social History     Socioeconomic History    Marital status: Divorced     Spouse name: Not on file    Number of children: Not on file    Years of education: Not on file    Highest education level: Not on file   Occupational History    Not on file   Tobacco Use    Smoking status: Former Smoker     Quit date: 12/28/2019     Years since quitting: 0.6    Smokeless tobacco: Never Used   Substance and Sexual Activity    Alcohol use: Yes     Alcohol/week:  7.0 standard drinks     Types: 7 Cans of beer per week    Drug use: Never    Sexual activity: Not on file   Social History Narrative    Not on file         Review of Systems     Review of Systems   Constitutional: Negative for chills, diaphoresis and fever.   Respiratory: Negative for cough and shortness of breath.    Cardiovascular: Positive for chest pain. Negative for palpitations, orthopnea, leg swelling and PND.   Gastrointestinal: Positive for heartburn. Negative for abdominal pain, constipation, diarrhea, nausea and vomiting.   Neurological: Negative for dizziness.       Vitals and Physical Exam     Nicholas Page's  height is 1.803 m (5\' 11" ) and weight is 120.2 kg (265 lb). His tympanic temperature is 36.2 C (97.2 F). His blood pressure is 164/85 and his pulse is 55. His respiration is 16 and oxygen saturation is 99%.  Body mass index is 36.96 kg/m.  No intake/output data recorded.  Physical Exam  Constitutional:        Appearance: Normal appearance.   HENT:      Head: Normocephalic and atraumatic.   Cardiovascular:      Rate and Rhythm: Normal rate and regular rhythm.      Pulses: Normal pulses.      Heart sounds: Normal heart sounds. No murmur heard.  No friction rub. No gallop.    Pulmonary:      Breath sounds: No wheezing, rhonchi or rales.   Abdominal:      General: Abdomen is flat.      Palpations: Abdomen is soft.   Musculoskeletal:      Right lower leg: No edema.      Left lower leg: No edema.   Skin:     General: Skin is warm and dry.   Neurological:      General: No focal deficit present.      Mental Status: He is alert.         Laboratory Data     Hematology:   Results in Past 730 Days  Result Component Current Result Previous Result   WBC 6.0 (08/13/2020) 6.1 (08/13/2020)   Hemoglobin 15.6 (08/13/2020) 15.8 (08/13/2020)   Hematocrit 48 (08/13/2020) 47 (08/13/2020)   Platelets 228 (08/13/2020) 232 (08/13/2020)     Chemistry:   Results in Past 730 Days  Result Component Current Result Previous Result   Sodium 141 (08/13/2020) 139 (08/13/2020)   Potassium 4.0 (08/13/2020) 4.6 (08/13/2020)   Creatinine 1.18 (H) (08/13/2020) 1.16 (08/13/2020)   Glucose 122 (H) (08/13/2020) 122 (H) (08/13/2020)   Calcium 9.2 (08/13/2020) 9.6 (08/13/2020)   Hemoglobin A1C 5.6 (08/13/2020) Not in Time Range   AST 27 (08/13/2020) 29 (08/13/2020)   ALT 31 (08/13/2020) 36 (08/13/2020)     Coagulation Studies:   No results found for requested labs within last 730 days.     Cardiac:   Results in Past 730 Days  Result Component Current Result Previous Result   TROP T 0 HR High Sensitivity 10 (08/13/2020) Not in Time Range   TROP T 3 HR High Sensitivity 8 (08/13/2020) Not in Time Range     Lipids:   Results in Past 730 Days  Result Component Current Result Previous Result   Cholesterol 205 (!) (08/13/2020) Not in Time Range   HDL 26 (L) (08/13/2020) Not in Time Range   Triglycerides 561 (!) (08/13/2020) Not in Time Range  LDL Calculated see below  (08/13/2020) Not in Time Range   Chol/HDL Ratio 7.9 (08/13/2020) Not in Time Range     Cardiac/Imaging Data & Risk Scores     ECG/Telemetry: Sinus brady at 57 bpm without ischemic changes         EXERCISE STRESS ECHO COMPLETE 08/14/2020    Interpretation Summary   Technically difficult study due to body habitus / poor acoustic windows.   Normal LV size, mass and resting function without wall motion abnormalities.  The calculated resting LVEF is 57%.   Mildly dilated RV with low normal function.   Mild to moderate aortic valve stenosis with mild regurgitation.   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   Estimated normal resting pulmonary artery systolic pressure.   Moderately reduced aerobic capacity with no exercise induced chest pain.   Mildly positive stress ECG for ischemia at 81% of maximum predicted heart rate.  No inducible arrhythmias.   No significant augmentation of LV systolic function with stress.   Non-diagnostic exercise stress echocardiogram for myocardial ischemia due to inability to achieve target heart rate.  Consider a pharmacologic nuclear stress test or CT coronary perfusion for further evaluation if clinically indicated.                FRAIL Scoring                Impression and Plan     Principal Problem:    Chest pain  Active Problems:    Lumbar disc disease    Constipation     This is an 64 y.o. male who we were asked to see for mildly positive EKG stress test. He has a PMH of lumbar disc disease s/p lumbar laminectomy x2, constipation, headaches, patient has not seen a doctor in 20-30 years. He presented to the ED with chest and epigastric pain for ~3 years.    Atypical chest pain  - Pain is atypical, worsens with exertion but spontaneously resolves and burning in description  - Patient with mildly positive EKG criteria  - Troponins non-elevated, non-dynamic  - EKG without ischemic changes  - Chest pain possibly related to hypertensive urgency on presentation. Patient has  continued to remain hypertensive  - Recommend nuclear stress test tomorrow, NPO at midnight, hold caffeine 24 hours prior to stress test  - Continue ASA 325mg  daily  - Patient should have PCP outpatient follow-up organized for optimization of cholesterol and antihypertensive regimen  - Monitor via continuous tele  - Daily BMP/Mg  - Replete electrolytes to maintain K>4 and <5, Mg>2    The above recommendations are preliminary and pending discussion with the Cardiology Consult Attending.    Patient was seen and discussed with Dr. Celine Mans.    Andres Labrum, PA  Electronically signed on 08/14/2020 at 2:14 PM.      Cardiology Attending Addendum    I saw and evaluated the patient. I agree with the findings and plan of care as documented.    64 year old man presenting with fairly atypical chest pain symptoms with exercise stress testing failing to demonstrate ischemia however suboptimal study with low functional capacity and low cardiac workload achieved.  There were subtle ST segment changes at peak/early recovery.  It would be reasonable to continue evaluation with a pharmacologic nuclear stress test.  We will plan on completing this tomorrow.    Luther Bradley, MD 7:54 PM 08/14/2020  Cardiovascular Disease  [facility]

## 2020-08-14 NOTE — Progress Notes (Addendum)
08/14/20 0653   UM Patient Class Review   Patient Class Review Observation   Patient class effective 08/13/20     [facility] RN,  Utilization Management    Pager#[phone]    7:19AM Writer called into [facility] room at extension [phone] giving verbal notification of observation level of care via the [place]. [id number] will be mailed to home address verified by patient.

## 2020-08-14 NOTE — ED Notes (Signed)
Pt having some epigastric pain - rates as "little". Sinus brady. NPO. Exercise stress planned for today.

## 2020-08-14 NOTE — ED Notes (Signed)
Plan of Care     Telemetry  NPO at midnight for NUC stress is AM  Med admin  Vitals every 4 hours  Comfort measures as needed

## 2020-08-15 ENCOUNTER — Inpatient Hospital Stay: Payer: Medicare Other

## 2020-08-15 DIAGNOSIS — R079 Chest pain, unspecified: Secondary | ICD-10-CM

## 2020-08-15 LAB — NUC SPECT MPI STRESS/REST STUDY
BMI: 37 kg/m2
BP Diastolic: 70 mmHg
BP Systolic: 155 mmHg
BSA: 2.45 m2
Heart Rate: 56 {beats}/min
Height: 71 in
LV CO BSA Index: 1.74 L/min/m2
LV Cardiac Output: 4.26 L/min
LV Diastolic Volume Index: 44.5 mL/m2
LV SV BSA Index: 31 mL/m2
LV SV Height Index: 42.1 mL/m
LV Stroke Volume: 76 mL
LV Systolic Volume Index: 13.5 mL/m2
LVED Volume BSA Index: 44 ml/m2
LVED Volume BSA Index: 44.5 mL/m2
LVED Volume Height Index: 60.4 mL/m
LVED Volume: 109 mL
LVEF (Volume): 70 %
LVEF Stress: 62 %
LVEF: 69 %
LVES Volume BSA Index: 13 ml/m2
LVES Volume BSA Index: 13.5 mL/m2
LVES Volume Height Index: 18.3 mL/m
LVES Volume: 33 mL
Peak DBP: 81 mmHg
Peak Filling Rate: 2.35 EDV/sec
Peak HR: 85 {beats}/min
Peak SBP: 173 mmHg
Percent MPHR: 54.8 %
RPP: 14705 BPM x mmHG
RR Interval: 1071.43 ms
Stress LV SV BSA Index: 28.6 mL/m2
Stress LV SV Height Index: 38.8 mL/m
Stress LV Stroke Volume Change: -6 mL
Stress LV Stroke Volume: 70 mL
Stress LVED Volume BSA Index: 46 ml/m2
Stress LVED Volume BSA Index: 46.1 mL/m2
Stress LVED Volume Change: 4 mL
Stress LVED Volume Height Index: 62.7 mL/m
Stress LVED Volume: 113 mL
Stress LVEF (Volume) Change: -7.8 %
Stress LVEF (Volume): 61.9 %
Stress LVES Volume BSA Index: 17.6 mL/m2
Stress LVES Volume BSA Index: 18 ml/m2
Stress LVES Volume Change: 10 mL
Stress LVES Volume Height Index: 23.8 mL/m
Stress LVES Volume: 43 mL
Stress Peak Filling Rate: 2.66 EDV/sec
Stress duration (min): 3 min
Weight (lbs): 265 [lb_av]
Weight: 4240 oz

## 2020-08-15 MED ORDER — ATORVASTATIN CALCIUM 40 MG PO TABS *I*
40.0000 mg | ORAL_TABLET | Freq: Every day | ORAL | 0 refills | Status: DC
Start: 2020-08-15 — End: 2020-09-17

## 2020-08-15 MED ORDER — SODIUM CHLORIDE 0.9 % IV SOLN WRAPPED *I*
300.0000 mL | Status: DC
Start: 2020-08-15 — End: 2020-08-15
  Administered 2020-08-15: 300 mL via INTRAVENOUS

## 2020-08-15 MED ORDER — SODIUM CHLORIDE 0.9 % INJ (FLUSH) WRAPPED *I*
10.0000 mL | Status: DC | PRN
Start: 2020-08-15 — End: 2020-08-15

## 2020-08-15 MED ORDER — REGADENOSON 0.4 MG/5ML (LEXISCAN) IV SOLN *I*
0.4000 mg | Freq: Once | INTRAVENOUS | Status: AC
Start: 2020-08-15 — End: 2020-08-15
  Administered 2020-08-15: 0.4 mg via INTRAVENOUS

## 2020-08-15 MED ORDER — TECHNETIUM TC-99M SESTAMIBI (CARDIOLITE) IV *I*
4.0000 | PACK | Freq: Once | INTRAVENOUS | Status: AC
Start: 2020-08-15 — End: 2020-08-15
  Administered 2020-08-15: 8.42 via INTRAVENOUS

## 2020-08-15 MED ORDER — PANTOPRAZOLE SODIUM 40 MG PO TBEC *I*
40.0000 mg | DELAYED_RELEASE_TABLET | Freq: Every day | ORAL | 0 refills | Status: DC
Start: 2020-08-15 — End: 2020-09-17

## 2020-08-15 MED ORDER — ALBUTEROL SULFATE (2.5 MG/3ML) 0.083% IN NEBU *I*
2.5000 mg | INHALATION_SOLUTION | RESPIRATORY_TRACT | Status: DC | PRN
Start: 2020-08-15 — End: 2020-08-15

## 2020-08-15 MED ORDER — CAFFEINE CITRATE 20 MG/ML IJ SOLN *I*
60.0000 mg | INTRAVENOUS | Status: DC | PRN
Start: 2020-08-15 — End: 2020-08-15
  Administered 2020-08-15: 60 mg via INTRAVENOUS

## 2020-08-15 MED ORDER — NITROGLYCERIN 0.4 MG SL SUBL *I*
0.4000 mg | SUBLINGUAL_TABLET | SUBLINGUAL | Status: DC | PRN
Start: 2020-08-15 — End: 2020-08-15

## 2020-08-15 MED ORDER — AMLODIPINE BESYLATE 5 MG PO TABS *I*
5.0000 mg | ORAL_TABLET | Freq: Every day | ORAL | 0 refills | Status: DC
Start: 2020-08-15 — End: 2020-09-17

## 2020-08-15 MED ORDER — TECHNETIUM TC-99M SESTAMIBI (CARDIOLITE) IV *I*
12.0000 | PACK | Freq: Once | INTRAVENOUS | Status: AC
Start: 2020-08-15 — End: 2020-08-15
  Administered 2020-08-15: 25.1 via INTRAVENOUS

## 2020-08-15 MED ORDER — AMINOPHYLLINE 25 MG/ML IV SOLN *I*
100.0000 mg | INTRAVENOUS | Status: DC | PRN
Start: 2020-08-15 — End: 2020-08-15

## 2020-08-15 NOTE — ED Notes (Signed)
Plan of Care     Meds per [facility]  VSq4  Nuc stress test   NPO   Comfort measures

## 2020-08-15 NOTE — ED Notes (Signed)
Assessment complete, Pt denies SOB or CP. Pt resting comfortably and has no complaints. Currently in NSR on tele in the 60's. Pt updated on plan of care. Call bell and bedside table within reach. Bed in lowest position. Will continue to monitor per orders.

## 2020-08-15 NOTE — ED Notes (Signed)
Assumed care of patient.  A&Ox4, independent.  No chest pain noted.  Eager to know if he is going home.  Will continue to monitor

## 2020-08-15 NOTE — ED Notes (Signed)
Patient resting comfortably. Alert and oriented. Complains of headache. Tylenol given. Assessment done. Plan discussed. Vitals stable. Call bell/bedside table within reach.

## 2020-08-15 NOTE — ED Notes (Signed)
Pt to Echo

## 2020-08-15 NOTE — Provider Consult (Addendum)
[facility]     Cardiology Progress Note    Date of Consult: 08/15/2020    Name: Nicholas Page Attending: No att. providers found   DOB: 01/19/1956 PCP: Albertine Grates, MD   EMRN: [id number] Primary Cardiologist: None    Admit Date:  08/13/2020     Subjective     I had the pleasure of seeing Nicholas Page in cardiology followup on 08/15/2020.     This morning Nicholas Page is feeling well without any acute complaints. Currently denies chest pain, shortness of breath, or palpitations.     Past Medical History:   Diagnosis Date    Chronic headaches     Lumbar disc disease      Past Surgical History:   Procedure Laterality Date    LUMBAR LAMINECTOMY      TONSILLECTOMY         Medications     Current Facility-Administered Medications   Medication Dose Route Frequency    pantoprazole  40 mg Oral QAM    aspirin EC  325 mg Oral Daily    docusate sodium  100 mg Oral BID       Vitals and Physical Exam     [name]'s  height is 1.803 m (5\' 11" ) and weight is 120.2 kg (265 lb). His oral temperature is 36.6 C (97.8 F). His blood pressure is 160/93 (abnormal) and his pulse is 60. His respiration is 18 and oxygen saturation is 100%.  Body mass index is 36.96 kg/m.  No intake/output data recorded.  Objective:  General Appearance:  Comfortable, well-appearing and in no acute distress.    Vital signs: (most recent): Blood pressure (!) 160/93, pulse 60, temperature 36.6 C (97.8 F), temperature source Oral, resp. rate 18, height 1.803 m (5\' 11" ), weight 120.2 kg (265 lb), SpO2 100 %.  Vital signs are normal.    Lungs:  Normal effort and normal respiratory rate.  Breath sounds clear to auscultation.  No rales, wheezes or rhonchi.    Heart: Normal rate.  Regular rhythm.  S1 normal and S2 normal.  No murmur, gallop or friction rub.   Abdomen: Abdomen is soft.  Bowel sounds are normal.   There is no abdominal tenderness.     Extremities: There is no dependent edema.    Pulses: Distal pulses are intact.    Neurological:  Patient is alert.    Skin:  Warm and dry.      Laboratory Data     Hematology:   Results in Past 730 Days  Result Component Current Result Previous Result   WBC 6.0 (08/13/2020) 6.1 (08/13/2020)   Hemoglobin 15.6 (08/13/2020) 15.8 (08/13/2020)   Hematocrit 48 (08/13/2020) 47 (08/13/2020)   Platelets 228 (08/13/2020) 232 (08/13/2020)     Chemistry:   Results in Past 730 Days  Result Component Current Result Previous Result   Sodium 141 (08/13/2020) 139 (08/13/2020)   Potassium 4.0 (08/13/2020) 4.6 (08/13/2020)   Creatinine 1.18 (H) (08/13/2020) 1.16 (08/13/2020)   Glucose 122 (H) (08/13/2020) 122 (H) (08/13/2020)   Calcium 9.2 (08/13/2020) 9.6 (08/13/2020)   Hemoglobin A1C 5.6 (08/13/2020) Not in Time Range   AST 27 (08/13/2020) 29 (08/13/2020)   ALT 31 (08/13/2020) 36 (08/13/2020)     Coagulation Studies:   No results found for requested labs within last 730 days.     Cardiac:   Results in Past 730 Days  Result Component Current Result Previous Result   TROP T 0 HR High Sensitivity 10 (08/13/2020)  Not in Time Range   TROP T 3 HR High Sensitivity 8 (08/13/2020) Not in Time Range     Lipids:   Results in Past 730 Days  Result Component Current Result Previous Result   Cholesterol 205 (!) (08/13/2020) Not in Time Range   HDL 26 (L) (08/13/2020) Not in Time Range   Triglycerides 561 (!) (08/13/2020) Not in Time Range   LDL Calculated see below (08/13/2020) Not in Time Range   Chol/HDL Ratio 7.9 (08/13/2020) Not in Time Range       Cardiac/Imaging Data & Risk Scores     ECG/Telemetry:        EXERCISE STRESS ECHO COMPLETE 08/14/2020    Interpretation Summary   Technically difficult study due to body habitus / poor acoustic windows.   Normal LV size, mass and resting function without wall motion abnormalities.  The calculated resting LVEF is 57%.   Mildly dilated RV with low normal function.   Mild to moderate aortic valve stenosis with mild regurgitation.   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   Estimated  normal resting pulmonary artery systolic pressure.   Moderately reduced aerobic capacity with no exercise induced chest pain.   Mildly positive stress ECG for ischemia at 81% of maximum predicted heart rate.  No inducible arrhythmias.   No significant augmentation of LV systolic function with stress.   Non-diagnostic exercise stress echocardiogram for myocardial ischemia due to inability to achieve target heart rate.  Consider a pharmacologic nuclear stress test or CT coronary perfusion for further evaluation if clinically indicated.                FRAIL Scoring                Impression and Plan     Principal Problem:    Chest pain  Active Problems:    Lumbar disc disease    Constipation     This is an 64 y.o. male who we were asked to see for mildly positive EKG stress test. He has a PMH of lumbar disc disease s/p lumbar laminectomy x2, constipation, headaches, patient has not seen a doctor in 20-30 years. He presented to the ED with chest and epigastric pain for ~3 years.    Atypical chest pain  - Pain is atypical, worsens with exertion but spontaneously resolves and burning in description  - Patient with mildly positive EKG criteria  - Troponins non-elevated, non-dynamic  - EKG without ischemic changes  - Chest pain possibly related to hypertensive urgency on presentation. Patient has continued to remain hypertensive  - Recommend nuclear stress test today, keep NPO, hold caffeine. Further recommendations pending results  - Patient should have PCP outpatient follow-up organized for optimization of cholesterol and antihypertensive regimen  - Monitor via continuous tele  - Daily BMP/Mg  - Replete electrolytes to maintain K>4 and <5, Mg>2    The above recommendations are preliminary and pending discussion with the Cardiology Consult Attending.    Patient was seen and discussed with Dr. Celine Mans.    Andres Labrum, PA  Electronically signed on 08/15/2020 at 7:58 AM.    Cardiology Attending Addendum    I saw and  evaluated the patient. I agree with the findings and plan of care as documented.  Persistently hypertensive.  Consider initiation of antihypertensive therapy, amlodipine 5 mg daily.  Nuclear stress test today.  Further recommendations pending results.    Luther Bradley, MD 12:46 PM 08/15/2020  Cardiovascular Disease  [facility]

## 2020-08-15 NOTE — Discharge Instructions (Addendum)
You were seen in the ED for chest pain. You had a stress test echocardiogram (ultrasound of your heart) that showed some abnormality concerning for damage to the heart, so you were seen by cardiology and recommended another test to confirm this. You had a nuclear medicine stress test that was normal and showed no signs of heart damage.     While this is reassuring, it will be important to manage your high blood pressure and high cholesterol.     We will be starting you on amlodipine 5 mg to take once a day to help with the blood pressure and atorvastatin 40 mg to take once a day to help with the cholesterol.     Your prescriptions will be available at [facility] in [place].    More importantly, improving your diet with less salt and less fat will be important in improving these things. Also, regular exercise, as little as 30 minute walks everyday can greatly improve these things and help reduce your risk of heart attack and stroke, among many other problems.     I will be placing a rapid referral to a primary care provider to help you get in to see them sooner.     You can also pickup a prescription for prontonix (pantoprazole) 40 mg to take once a day to help with indigestion.     Please return to the ED or contact your PCP if you develop new chest pain/pressure, difficulty breathing, vomiting, or any other concerning symptoms.

## 2020-08-15 NOTE — ED Obs Notes (Addendum)
ED OBSERVATION DISCHARGE NOTE     Patient seen by me today, 08/15/2020 at 08:10am.    Current patient status: Inpatient    Chief Complaint:   Chief Complaint   Patient presents with    Chest Pain       Subjective:  Patient states he feels well today. Disappointed he cannot eat and his NM stress is at 12:30 today. Otherwise, no new complaints.    Nursing Pain Score:  Last Nursing documented pain:  0-10 Scale: 2 (08/15/20 0526)      Vitals: Reviewed  Patient Vitals for the past 24 hrs:   BP Temp Temp src Pulse Resp SpO2 Height Weight   08/15/20 0527 (!) 160/93 36.6 C (97.8 F) Oral 60 18 100 % -- --   08/15/20 0112 163/78 35.2 C (95.4 F) TEMPORAL -- 18 100 % -- --   08/14/20 2056 133/68 36.2 C (97.2 F) TEMPORAL 62 -- 99 % -- --   08/14/20 1749 143/79 35.9 C (96.6 F) TEMPORAL 66 -- 99 % -- --   08/14/20 1450 139/81 36 C (96.8 F) Tympanic 60 18 99 % -- --   08/14/20 1058 164/85 -- -- 55 -- -- 1.803 m (5\' 11" ) 120.2 kg (265 lb)   08/14/20 0848 150/89 36.2 C (97.2 F) Tympanic 59 -- 99 % -- --       Physical Examination:  Physical Exam  Vitals and nursing note reviewed.   Constitutional:       General: He is not in acute distress.     Appearance: He is well-developed. He is obese. He is not ill-appearing, toxic-appearing or diaphoretic.   HENT:      Head: Normocephalic and atraumatic.   Neck:      Vascular: No JVD.   Cardiovascular:      Rate and Rhythm: Normal rate and regular rhythm.      Pulses:           Radial pulses are 2+ on the right side and 2+ on the left side.      Heart sounds: Murmur heard.       Pulmonary:      Effort: Pulmonary effort is normal.      Breath sounds: Normal breath sounds.   Abdominal:      General: Bowel sounds are normal.      Palpations: Abdomen is soft.      Tenderness: There is no abdominal tenderness.   Musculoskeletal:      Right lower leg: No tenderness. No edema.      Left lower leg: No tenderness. No edema.   Skin:     General: Skin is warm.      Capillary Refill:  Capillary refill takes less than 2 seconds.   Neurological:      Mental Status: He is alert and oriented to person, place, and time.   Psychiatric:         Mood and Affect: Mood normal.         Behavior: Behavior normal.         EKG: NSR normal intervals and axis w/o ischemia    Lab Results:   All labs in the last 24 hours:   Recent Results (from the past 24 hour(s))   Exercise Stress Echo Complete    Collection Time: 08/14/20 12:05 PM   Result Value Ref Range    BSA 2.45 m2    Height 71 in    BMI 37 kg/m2    Weight  4,240 oz    Weight (lbs) 265.00 lbs    MPHR 155.0 bpm    Heart Rate 55 bpm    RR Interval 4,166.06 ms    BP Systolic 301 mmHg    BP Diastolic 85 mmHg    ECG PR interval 180 ms    ECG QRS interval 120 ms    LV Septal Thickness 0.72 cm    LVED Diameter 4.55 cm    LV Posterior Wall Thickness 0.77 cm    LVOT PWD Velocity (peak) 94.8 cm/s    LVOT PWD Velocity (mean) 69.1 cm/s    LVOT PWD VTI 20.24 cm    LA Diameter 3.8 cm    Deceleration Time - AR 2,070.1 ms    Pressure Half-Time - AR 600.3 ms    AR Velocity (peak) 474.6 cm/s    LVOT + AV Gradient (mean) 13.0 mmHg    AV CWD Velocity (Peak) 240.6 cm/s    AV CWD Velocity (Mean) 172.5 cm/s    AV CWD VTI 56.1 cm    Peak Gradient - TR 23.9 mmHg    Peak Velocity - TR 241.98 cm/s    Aortic Diameter (sinus of Valsalva) 3.3 cm    LVED Diameter BSA Index 1.86 cm/m2    LVED Diameter Height Index 2.52 cm/m    LV ASE Mass 105.6 gm    LV ASE Mass BSA Index 43.1 gm/m2    LV ASE Mass Height Index 58.5 gm/m    LV ASE Mass Height 2.7 Index 21.5 gm/m2.7    LA Diameter BSA Index 1.6 cm/m2    LA Diameter Height Index 2.1 cm/m    LVOT + AV Gradient (peak) 23.2 mmHg    AV Gradient (peak) 19.6 mmHg    AV Gradient (mean) 11.1 mmHg    LVOT/AV Velocity Ratio 0.39     AR CWD Gradient (peak) 90.1 mmHg    LV wall/cavity ratio 0.33     Peak HR 126 bpm    Percent MPHR 81.3 %    Peak SBP 172 mmHg    RPP 21,672 BPM x mmHG    Peak DBP 92 mmHg    Stress duration (min) 3 min    Stress duration  (sec) 0 sec    Stress Peak Stage 1.00     Estimated workload 5.0 METS    RV Peak Systolic Pressure 60.1 mmHg    RA Pressure Estimate 10 mmHg    LVED Volume 74.0 mL    LVES Volume 32.0 mL    LVED Volume BSA Index 30.2 mL/m2    LVED Volume Height Index 41.0 mL/m    LVES Volume BSA Index 13.1 mL/m2    LVES Volume Height Index 17.7 mL/m    LVEF (Volume) 57 %    LV Stroke Volume 42.0 mL    LV SV BSA Index 17.1 mL/m2    LV SV Height Index 23.3 mL/m    LV Cardiac Output 2.31 L/min    LV CO BSA Index 0.94 L/min/m2    Echo RV Stroke Work Index Estimate 409.7 mmHgmL/m2    AV Area (LV SV Mtd) 0.75 cm2    AV Area (LV SV) BSA Index 0.31 cm2/m2    Pulmonary Vascular Resistance Estimate 10.3 mmHg    LVED Volume BSA Index 30 ml/m2    LVES Volume BSA Index 13 ml/m2    LV Systolic Volume Index 09.3 mL/m2    LV Diastolic Volume Index 23.5 mL/m2  Imaging findings: *Chest standard frontal and lateral views  Result Date: 08/13/2020  No acute cardiopulmonary disease.     Assessment: 64 y/o m pmh of tobacco use disorder 60 pack year history recently quit placed in obs from the ED for midsternal/epigastric burning pain that has been an issue for around 3 years usually with exertion, though more recently is occurring at rest also. He has not been followed by a PCP regularly over the past 20-30 years. He takes generic excedrin regularly, but not excessively for headaches at home along with stool softeners for constipation, but otherwise denies any medical problems or any other recent complaints. He has a new PCP that he will be able to follow up with. Found to have mildly positive EKG during exercise stress echo yesterday, thus Cardiology consult and recommendations appreciated. Discussed need for NM stress tomorrow w/patient which is planned for 12:30 today.  Assuming nonactionable exam, patient may end up discharged today. Will discuss need for HTN/HLD medications.          Plan:   1. Chest pain - possibly angina, but also consistent  w/GERD/PUD  -continuing pantoprazole 40 mg po every day   -elevated BP to 160/93, chol 205, trig 561; will need to treat either here or outpatient   -exercise stress echo w/ekg changes  - pending NM stress today  -daily ASA 325mg  PO     2. Constipation   -daily colace     3. Hx of tobaccoism / now quit   - pt is set with new PCP and can have PFT's doneas outpatient   -CXR NAD    Medically preferred DVT prophylaxis: None      08/15/20   5:28 PM    The patient is now stable for discharge.     Cardiac Testing:  Telemetry: No events, ECG: as above, Echocardiogram: mildly positive for ischemia and Stress Test: Normal  Consults: Cardiology      Assessment: As above. Patient had NM stress today that was unremarkable. Patient is cleared for discharge. To start amlodipine, protonix, and atorvastatin and place rapid referral to assist in earlier f/u.     Diagnoses that have been ruled out:   None   Diagnoses that are still under consideration:   None   Final diagnoses:   Chest pain, unspecified type   Chest pain       Plan:  Chest pain - improved, likely GERD, but possible angina component. NM stress normal and reassuring labs.   - Amlodipine 5 mg po every day   - Atorvastatin 40 mg po every day   - Protonix 40 mg po every day   - Rapid referral to PCP  - Return precautions provided  - Discussed diet/exercise  - Patient safe for discharge and agreeable w/plan  Disposition: Home  Follow-up:  within 1 month.  with  PCP  Smoking Cessation: NA         Author: Felicity Pellegrini, PA  Note created: 08/15/2020  at: 7:36 AM     Elveria Rising, [place]  08/15/20 0848       Elveria Rising, [place]  08/15/20 1733

## 2020-08-15 NOTE — ED Notes (Signed)
Plan of Care     Telemetry  Nuclear stress test  NPO  Vitals  Labs  Ambulates independently

## 2020-08-16 ENCOUNTER — Telehealth: Payer: Self-pay | Admitting: Emergency Medicine

## 2020-08-16 NOTE — Telephone Encounter (Signed)
He is doing "pretty good"  Filled discharge medications and taking without issue    Verified that ED referral to primary care had been placed; as he was not able to get in to see his sisters PCP until 11/2020     Sunny Schlein, DO  08/16/20 1227

## 2020-09-12 ENCOUNTER — Encounter: Payer: Self-pay | Admitting: Family Medicine

## 2020-09-17 ENCOUNTER — Other Ambulatory Visit
Admission: RE | Admit: 2020-09-17 | Discharge: 2020-09-17 | Disposition: A | Discharge status: Home / Self Care | Payer: Self-pay | Source: Ambulatory Visit | Attending: Family Medicine | Admitting: Family Medicine

## 2020-09-17 ENCOUNTER — Ambulatory Visit: Payer: Self-pay | Admitting: Family Medicine

## 2020-09-17 ENCOUNTER — Telehealth: Payer: Self-pay | Admitting: Family Medicine

## 2020-09-17 VITALS — BP 140/86 | HR 77 | Temp 98.2°F | Ht 71.0 in | Wt 272.0 lb

## 2020-09-17 DIAGNOSIS — E782 Mixed hyperlipidemia: Secondary | ICD-10-CM

## 2020-09-17 DIAGNOSIS — I10 Essential (primary) hypertension: Secondary | ICD-10-CM | POA: Insufficient documentation

## 2020-09-17 DIAGNOSIS — K219 Gastro-esophageal reflux disease without esophagitis: Secondary | ICD-10-CM

## 2020-09-17 LAB — BASIC METABOLIC PANEL
Anion Gap: 14 (ref 7–16)
CO2: 26 mmol/L (ref 20–28)
Calcium: 10 mg/dL (ref 8.6–10.2)
Chloride: 103 mmol/L (ref 96–108)
Creatinine: 1.14 mg/dL (ref 0.67–1.17)
GFR,Black: 77 *
GFR,Caucasian: 67 *
Glucose: 84 mg/dL (ref 60–99)
Lab: 23 mg/dL — ABNORMAL HIGH (ref 6–20)
Potassium: 4.2 mmol/L (ref 3.3–5.1)
Sodium: 143 mmol/L (ref 133–145)

## 2020-09-17 MED ORDER — AMLODIPINE BESYLATE 5 MG PO TABS *I*
5.0000 mg | ORAL_TABLET | Freq: Every day | ORAL | 5 refills | Status: DC
Start: 2020-09-17 — End: 2020-10-16

## 2020-09-17 MED ORDER — PANTOPRAZOLE SODIUM 40 MG PO TBEC *I*
40.0000 mg | DELAYED_RELEASE_TABLET | Freq: Every day | ORAL | 5 refills | Status: DC
Start: 2020-09-17 — End: 2020-10-16

## 2020-09-17 MED ORDER — ATORVASTATIN CALCIUM 40 MG PO TABS *I*
40.0000 mg | ORAL_TABLET | Freq: Every day | ORAL | 3 refills | Status: DC
Start: 2020-09-17 — End: 2021-09-05

## 2020-09-17 NOTE — Telephone Encounter (Signed)
RIM has contacted the office about recent insurance information. According to RIM , not eligible for Medicare A and there are insurance question that he  Needs to speak to RIM.    Note and contact information was give to check out to provide to patient

## 2020-09-17 NOTE — Progress Notes (Signed)
[facility]  [facility]    Progress Note    HISTORY:  Chief Complaint   Patient presents with    Follow-up     Med review     History of Present Illness:    HPI   Nicholas Page is a 64 y.o. male who presents to clinic for an acute visit to discuss medications prior to his NPV on 10/31/20.  He was recently seen at the ED for chest pain. Found to have HTN at that time. He was started on Norvasc 5 mg daily at that time.  HTN   HLD - BP today is 140/86. They do not check BP at home. Current regimen includes Norvasc 5 mg daily (has been out the past 2 days), Lipitor 40 mg daily. pounding headache, chest pain, dizziness on standing.   Denies fevers, chills, chest pain, SOB, nausea, vomiting, changes in bowel or bladder habits.     Problems:  Patient Active Problem List   Diagnosis Code    Lumbar disc disease M51.9    Constipation K59.00    Chest pain R07.9        Past Medical/Surgical History:   Past Medical History:   Diagnosis Date    Chronic headaches     Lumbar disc disease      Past Surgical History:   Procedure Laterality Date    LUMBAR LAMINECTOMY      TONSILLECTOMY           Allergies:    Allergies   Allergen Reactions    Ibuprofen Hives       Current medications:    Current Outpatient Medications   Medication Sig    amLODIPine (NORVASC) 5 mg tablet Take 1 tablet (5 mg total) by mouth daily    pantoprazole (PROTONIX) 40 mg EC tablet Take 1 tablet (40 mg total) by mouth daily  Swallow whole. Do not crush, break, or chew.    atorvastatin (LIPITOR) 40 mg tablet Take 1 tablet (40 mg total) by mouth daily    aspirin-acetaminophen-caffeine (EXCEDRIN MIGRAINE) 250-250-65 mg per tablet Take 1 tablet by mouth every 8 hours as needed for Pain    docusate sodium (COLACE) 250 mg capsule Take 250 mg by mouth daily       Family History:    Family History   Problem Relation Age of Onset    Cancer Sister        Social/Occupational History:   Social History     Socioeconomic History    Marital status:  Divorced     Spouse name: Not on file    Number of children: Not on file    Years of education: Not on file    Highest education level: Not on file   Tobacco Use    Smoking status: Former Smoker     Quit date: 12/28/2019     Years since quitting: 0.7    Smokeless tobacco: Never Used   Substance and Sexual Activity    Alcohol use: Yes     Alcohol/week: 7.0 standard drinks     Types: 7 Cans of beer per week    Drug use: Never    Sexual activity: Not on file   Other Topics Concern    Not on file   Social History Narrative    Not on file         Review of Systems:    ROS   Other than HPI all ROS negative.  Vital Signs:   BP 140/86 (BP Location: Right arm, Patient Position: Sitting, Cuff Size: adult)    Pulse 77    Temp 36.8 C (98.2 F) (Temporal)    Ht 1.803 m (5\' 11" ) Comment: per pt   Wt 123.4 kg (272 lb)    SpO2 97%    BMI 37.94 kg/m       PHYSICAL EXAM:  Physical Exam  Physical Exam  Constitutional:       Appearance: Normal appearance.   HENT:      Head: Normocephalic and atraumatic.   Eyes:      Extraocular Movements: Extraocular movements intact.      Conjunctiva/sclera: Conjunctivae normal.      Pupils: Pupils are equal, round, and reactive to light.   Cardiovascular:      Comments: Well perfused RRR  Pulmonary:      Effort: Pulmonary effort is normal. No respiratory distress. CTAB  Musculoskeletal:      Cervical back: Normal range of motion.   Skin:     Findings: No lesion.   Neurological:      General: No focal deficit present.      Mental Status: She is alert and oriented to person, place, and time.   Psychiatric:         Mood and Affect: Mood normal.         Thought Content: Thought content normal.          Assessment:    Nicholas Page was seen today for follow-up.    Diagnoses and all orders for this visit:    Essential hypertension, benign  -     amLODIPine (NORVASC) 5 mg tablet; Take 1 tablet (5 mg total) by mouth daily  -     BASIC METABOLIC PANEL; Future    Gastroesophageal reflux disease,  unspecified whether esophagitis present  -     pantoprazole (PROTONIX) 40 mg EC tablet; Take 1 tablet (40 mg total) by mouth daily  Swallow whole. Do not crush, break, or chew.    Hyperlipidemia, mixed  -     atorvastatin (LIPITOR) 40 mg tablet; Take 1 tablet (40 mg total) by mouth daily       .      Plan:      HTN   HLD  - BP 140/86  - Will continue Norvasc 5 mg at this time as patient has been out of medication the past few days  - Will check BMP at this time since starting new meds  - Refill of lipitor 40 mg daily provided; no myalgias    GERD  - Symptoms well managed with Protonix, which was started at the ED  - Refill provided today      Follow up - Keep NPV    [place]

## 2020-09-18 ENCOUNTER — Telehealth: Payer: Self-pay | Admitting: Family Medicine

## 2020-09-18 NOTE — Telephone Encounter (Signed)
Spoke with patient and relayed message from Dr. Hoyson.

## 2020-09-18 NOTE — Telephone Encounter (Signed)
-----   Message from Dorina Hoyer, MD sent at 09/18/2020  8:03 AM EST -----  Your blood work has come back. It looks excellent. Please continue current medications.    Dorina Hoyer MD  [facility]

## 2020-09-18 NOTE — Result Encounter Note (Signed)
Your blood work has come back. It looks excellent. Please continue current medications.    Dorina Hoyer MD  [facility]

## 2020-10-16 ENCOUNTER — Other Ambulatory Visit: Payer: Self-pay | Admitting: Family Medicine

## 2020-10-16 DIAGNOSIS — I10 Essential (primary) hypertension: Secondary | ICD-10-CM

## 2020-10-16 DIAGNOSIS — K219 Gastro-esophageal reflux disease without esophagitis: Secondary | ICD-10-CM

## 2020-10-17 MED ORDER — PANTOPRAZOLE SODIUM 40 MG PO TBEC *I*
40.0000 mg | DELAYED_RELEASE_TABLET | Freq: Every day | ORAL | 1 refills | Status: DC
Start: 2020-10-17 — End: 2021-02-10

## 2020-10-17 MED ORDER — AMLODIPINE BESYLATE 5 MG PO TABS *I*
5.0000 mg | ORAL_TABLET | Freq: Every day | ORAL | 1 refills | Status: DC
Start: 2020-10-17 — End: 2021-04-14

## 2020-10-30 NOTE — H&P (Signed)
[facility]  [facility]    History and Physical    HISTORY:  Chief Complaint   Patient presents with    New Patient Visit     History of Present Illness:    HPI   Nicholas Page is a 65 y.o. male who presents to clinic to establish care and BP follow up.  Numbness in feet - He states that he has had some issues with numbness in the left leg due to his chronic lumbar back issues. It has now worsened to both feet about 3 years. He states that this has maintained to be fairly consistent. He notes that they will tingle when cold. He has back surgery in his mid-late 46s. He has not followed up with a spine doctor for many years.  Hemorrhoids - He states that he has had these consistently for many years. They have been bleeding more consistently starting about 1 year ago.  HTN - BP today is 136/74. Current regimen includes  Norvasc 5 mg daily, Lipitor 40 mg daily. Denies pounding headache, chest pain, dizziness on standing.   GERD - Currently well-managed with Protonix.  Vaccine hx - considering shingles vaccine as well as flu vaccine  COVID-19 vaccine - Moderna x2; plus a booster  Colonoscopy hx - has not had this  Denies fevers, chills, chest pain, SOB, nausea, vomiting, changes in bowel or bladder habits.     Problems:  Patient Active Problem List   Diagnosis Code    Lumbar disc disease M51.9    Constipation K59.00    Chest pain R07.9    Hypertension I10    GERD (gastroesophageal reflux disease) K21.9    Hemorrhoids K64.9        Past Medical/Surgical History:   Past Medical History:   Diagnosis Date    Chronic headaches     GERD (gastroesophageal reflux disease)     Hemorrhoids     Hypertension     Lumbar disc disease      Past Surgical History:   Procedure Laterality Date    LUMBAR LAMINECTOMY      TONSILLECTOMY           Allergies:    Allergies   Allergen Reactions    Ibuprofen Hives       Current medications:    Current Outpatient Medications   Medication Sig    pantoprazole (PROTONIX) 40  mg EC tablet Take 1 tablet (40 mg total) by mouth daily  Swallow whole. Do not crush, break, or chew.    amLODIPine (NORVASC) 5 mg tablet Take 1 tablet (5 mg total) by mouth daily    atorvastatin (LIPITOR) 40 mg tablet Take 1 tablet (40 mg total) by mouth daily    aspirin-acetaminophen-caffeine (EXCEDRIN MIGRAINE) 250-250-65 mg per tablet Take 1 tablet by mouth every 8 hours as needed for Pain    docusate sodium (COLACE) 250 mg capsule Take 250 mg by mouth daily       Family History:    Family History   Problem Relation Age of Onset    Cancer Sister        Social/Occupational History:   Social History     Socioeconomic History    Marital status: Divorced     Spouse name: Not on file    Number of children: Not on file    Years of education: Not on file    Highest education level: Not on file   Tobacco Use    Smoking status:  Former Smoker     Quit date: 12/28/2019     Years since quitting: 0.8    Smokeless tobacco: Never Used   Substance and Sexual Activity    Alcohol use: Yes     Alcohol/week: 7.0 standard drinks     Types: 7 Cans of beer per week    Drug use: Never    Sexual activity: Not Currently     Birth control/protection: None   Other Topics Concern    Not on file   Social History Narrative    Not on file         Review of Systems:    ROS   Other than HPI all ROS negative.     Vital Signs:   BP 136/74 (BP Location: Left arm, Patient Position: Sitting, Cuff Size: adult)    Pulse 70    Temp 36.6 C (97.8 F) (Temporal)    Ht 1.803 m (5\' 11" )    Wt 123.1 kg (271 lb 4.8 oz)    SpO2 98%    BMI 37.84 kg/m       PHYSICAL EXAM:  Physical Exam  Physical Exam  Constitutional:       Appearance: Normal appearance.   HENT:      Head: Normocephalic and atraumatic.      Right Ear: Tympanic membrane normal.      Left Ear: Tympanic membrane normal.      Mouth/Throat:      Mouth: Mucous membranes are moist.   Eyes:      Extraocular Movements: Extraocular movements intact.      Conjunctiva/sclera: Conjunctivae  normal.      Pupils: Pupils are equal, round, and reactive to light.   Cardiovascular:      Rate and Rhythm: Normal rate and regular rhythm.      Heart sounds: Normal heart sounds. No murmur heard.     Pulmonary:      Effort: No respiratory distress.      Breath sounds: Normal breath sounds.   Abdominal:      General: Abdomen is flat. Bowel sounds are normal.      Palpations: Abdomen is soft.   Musculoskeletal:         General: Normal range of motion.      Cervical back: Normal range of motion.   Skin:     General: Skin is warm and dry.   Neurological:      General: No focal deficit present.      Mental Status: He is alert and oriented to person, place, and time.   Psychiatric:         Mood and Affect: Mood normal.         Behavior: Behavior normal.         Thought Content: Thought content normal.        Assessment:    Nicholas Page was seen today for new patient visit.    Diagnoses and all orders for this visit:    Healthcare maintenance    Hypertension, unspecified type    Gastroesophageal reflux disease, unspecified whether esophagitis present    Numbness in feet  -     AMB REFERRAL TO ORTHOPEDIC SURGERY    Hemorrhoids, unspecified hemorrhoid type    Need for shingles vaccine  -     Varicella-Zoster 2 doses (Shingrix)    Screening for colon cancer  -     Cologuard (External Lab - [facility])    Need for immunization against influenza  -  Flu vaccine quadrivalent greater than or equal to 61mo preservative free IM       .      Plan:      Establish Care Visit  - Vaccine hx - considering shingles vaccine as well as flu vaccine   - Shingles and flu vaccines provided today  - COVID-19 vaccine - Moderna x2; plus a booster  - Colonoscopy hx - has not had this   - Cologuard ordered    HTN   HLD  - BP at goal  - Will continue Norvasc 5 mg daily, Lipitor 40 mg daily    Numbness in Feet   Hx of Lumbar Back Disease  - Will have patient seen Ortho Spine for re-evaluation in setting of worsening feet numbness      Follow up - 6  months or sooner PRN    Dorina Hoyer MD  [facility]

## 2020-10-31 ENCOUNTER — Ambulatory Visit: Payer: Medicare Other | Admitting: Family Medicine

## 2020-10-31 ENCOUNTER — Encounter: Payer: Self-pay | Admitting: Family Medicine

## 2020-10-31 VITALS — BP 136/74 | HR 70 | Temp 97.8°F | Ht 71.0 in | Wt 271.3 lb

## 2020-10-31 DIAGNOSIS — Z23 Encounter for immunization: Secondary | ICD-10-CM

## 2020-10-31 DIAGNOSIS — K219 Gastro-esophageal reflux disease without esophagitis: Secondary | ICD-10-CM

## 2020-10-31 DIAGNOSIS — Z0000 Encounter for general adult medical examination without abnormal findings: Secondary | ICD-10-CM

## 2020-10-31 DIAGNOSIS — K649 Unspecified hemorrhoids: Secondary | ICD-10-CM

## 2020-10-31 DIAGNOSIS — R200 Anesthesia of skin: Secondary | ICD-10-CM

## 2020-10-31 DIAGNOSIS — Z1211 Encounter for screening for malignant neoplasm of colon: Secondary | ICD-10-CM

## 2020-10-31 DIAGNOSIS — I10 Essential (primary) hypertension: Secondary | ICD-10-CM | POA: Insufficient documentation

## 2020-11-22 ENCOUNTER — Telehealth: Payer: Self-pay | Admitting: Family Medicine

## 2020-11-22 LAB — AMB COLOGUARD (EXTERNAL LAB - EXACT SCIENCES): Cologuard (FIT DNA): POSITIVE — AB

## 2020-11-22 NOTE — Telephone Encounter (Signed)
-----   Message from Dorina Hoyer, MD sent at 11/22/2020  9:43 AM EST -----  Your cologuard colon cancer screening test came back as positive. What this means is that there is some level of abnormality noted. This does not mean that you have cancer necessarily but we do recommend having a colonoscopy to further investigate. In those who had a positive cologuard test like you have, they found the following percentages when a colonoscopy was performed: colorectal cancer [4.0%], advanced adenoma (including sessile serrated polyps greater than or equal to 1cm diameter) [20%] or non- advanced adenoma [31%]; or no colorectal neoplasia [45%]. What this means is that they found actual colon cancer in less than 5% of people but polyps that may be developing into cancer in up to 50% of people.    Would you be agreeable to proceeding with a colonoscopy next? That would be our recommendation.    Dorina Hoyer MD  [facility]

## 2020-11-22 NOTE — Result Encounter Note (Signed)
Your cologuard colon cancer screening test came back as positive. What this means is that there is some level of abnormality noted. This does not mean that you have cancer necessarily but we do recommend having a colonoscopy to further investigate. In those who had a positive cologuard test like you have, they found the following percentages when a colonoscopy was performed: colorectal cancer [4.0%], advanced adenoma (including sessile serrated polyps greater than or equal to 1cm diameter) [20%] or non- advanced adenoma [31%]; or no colorectal neoplasia [45%]. What this means is that they found actual colon cancer in less than 5% of people but polyps that may be developing into cancer in up to 50% of people.    Would you be agreeable to proceeding with a colonoscopy next? That would be our recommendation.    Ava Deguire MD  [facility]

## 2020-11-22 NOTE — Telephone Encounter (Signed)
Patient aware of MD message.  Patient states Medicare does not kick in untl June/July so will have to wait till then.

## 2020-11-28 ENCOUNTER — Ambulatory Visit: Payer: Self-pay | Admitting: Physical Medicine and Rehabilitation

## 2021-02-10 ENCOUNTER — Other Ambulatory Visit: Payer: Self-pay | Admitting: Family Medicine

## 2021-02-10 DIAGNOSIS — K219 Gastro-esophageal reflux disease without esophagitis: Secondary | ICD-10-CM

## 2021-02-11 MED ORDER — PANTOPRAZOLE SODIUM 40 MG PO TBEC *I*
40.0000 mg | DELAYED_RELEASE_TABLET | Freq: Every day | ORAL | 1 refills | Status: DC
Start: 2021-02-11 — End: 2021-08-08

## 2021-04-13 ENCOUNTER — Other Ambulatory Visit: Payer: Self-pay | Admitting: Family Medicine

## 2021-04-13 DIAGNOSIS — I10 Essential (primary) hypertension: Secondary | ICD-10-CM

## 2021-05-01 ENCOUNTER — Ambulatory Visit: Payer: Medicare Other | Attending: Family Medicine | Admitting: Family Medicine

## 2021-05-01 VITALS — BP 118/64 | HR 63 | Temp 97.1°F | Ht 71.0 in | Wt 271.8 lb

## 2021-05-01 DIAGNOSIS — Z23 Encounter for immunization: Secondary | ICD-10-CM | POA: Insufficient documentation

## 2021-05-01 DIAGNOSIS — M519 Unspecified thoracic, thoracolumbar and lumbosacral intervertebral disc disorder: Secondary | ICD-10-CM | POA: Insufficient documentation

## 2021-05-01 DIAGNOSIS — R200 Anesthesia of skin: Secondary | ICD-10-CM | POA: Insufficient documentation

## 2021-05-01 DIAGNOSIS — Z0000 Encounter for general adult medical examination without abnormal findings: Secondary | ICD-10-CM | POA: Insufficient documentation

## 2021-05-01 DIAGNOSIS — I10 Essential (primary) hypertension: Secondary | ICD-10-CM | POA: Insufficient documentation

## 2021-05-01 DIAGNOSIS — E782 Mixed hyperlipidemia: Secondary | ICD-10-CM | POA: Insufficient documentation

## 2021-05-01 DIAGNOSIS — R195 Other fecal abnormalities: Secondary | ICD-10-CM | POA: Insufficient documentation

## 2021-05-01 DIAGNOSIS — B356 Tinea cruris: Secondary | ICD-10-CM | POA: Insufficient documentation

## 2021-05-01 MED ORDER — NYSTATIN 100000 UNIT/GM EX POWD  *I*
Freq: Three times a day (TID) | CUTANEOUS | 3 refills | Status: DC
Start: 2021-05-01 — End: 2022-05-05

## 2021-05-01 MED ORDER — KETOCONAZOLE 2 % EX CREA *I*
TOPICAL_CREAM | Freq: Every day | CUTANEOUS | 11 refills | Status: DC
Start: 2021-05-01 — End: 2022-05-05

## 2021-05-01 NOTE — Progress Notes (Signed)
[facility]  [facility]    Progress Note    HISTORY:  Chief Complaint   Patient presents with    Follow-up     6 month    Welcome to Medicare visit     History of Present Illness:    HPI   Nicholas Page is a 65 y.o. male who presents to clinic for follow up.    Positive Cologuard - Noted on recent Cologuard in 10/2020. Advised to get Colonoscopy, which has not yet been completed.    HTN - BP today is 118/64. Current regimen includes Norvasc 5 mg daily, Lipitor 40 mg daily. Denies pounding headache, chest pain, dizziness on standing.   GERD - Currently well-managed with Protonix.  Vaccine hx - considering shingles vaccine as well as flu vaccine  COVID-19 vaccine - Moderna x2; plus a booster  Denies fevers, chills, chest pain, SOB, nausea, vomiting, changes in bowel or bladder habits.     Problems:  Patient Active Problem List   Diagnosis Code    Lumbar disc disease M51.9    Constipation K59.00    Chest pain R07.9    Hypertension I10    GERD (gastroesophageal reflux disease) K21.9    Hemorrhoids K64.9        Past Medical/Surgical History:   Past Medical History:   Diagnosis Date    Chronic headaches     GERD (gastroesophageal reflux disease)     Hemorrhoids     Hypertension     Lumbar disc disease      Past Surgical History:   Procedure Laterality Date    LUMBAR LAMINECTOMY      TONSILLECTOMY           Allergies:    Allergies   Allergen Reactions    Ibuprofen Hives       Current medications:    Current Outpatient Medications   Medication Sig    psyllium (METAMUCIL) 95 % packet Take 1 packet by mouth daily    amLODIPine (NORVASC) 5 mg tablet TAKE 1 TABLET (5 MG TOTAL) BY MOUTH DAILY.    pantoprazole (PROTONIX) 40 mg EC tablet Take 1 tablet (40 mg total) by mouth daily  Swallow whole. Do not crush, break, or chew.    atorvastatin (LIPITOR) 40 mg tablet Take 1 tablet (40 mg total) by mouth daily    ketoconazole (NIZORAL) 2 % cream Apply topically daily  to the following areas: groin     nystatin (NYSTATIN) powder Apply topically 3 times daily  to the following areas: groin    aspirin-acetaminophen-caffeine (EXCEDRIN MIGRAINE) 250-250-65 mg per tablet Take 1 tablet by mouth every 8 hours as needed for Pain       Family History:    Family History   Problem Relation Age of Onset    Cancer Sister        Social/Occupational History:   Social History     Socioeconomic History    Marital status: Divorced     Spouse name: Not on file    Number of children: Not on file    Years of education: Not on file    Highest education level: Not on file   Tobacco Use    Smoking status: Former Smoker     Quit date: 12/28/2019     Years since quitting: 1.3    Smokeless tobacco: Never Used   Substance and Sexual Activity    Alcohol use: Yes     Alcohol/week: 7.0 standard drinks  Types: 7 Cans of beer per week    Drug use: Never    Sexual activity: Not Currently     Birth control/protection: None   Other Topics Concern    Not on file   Social History Narrative    Not on file         Review of Systems:    ROS   Other than HPI all ROS negative.     Vital Signs:   BP 118/64 (BP Location: Right arm, Patient Position: Sitting, Cuff Size: adult)    Pulse 63    Temp 36.2 C (97.1 F) (Temporal)    Ht 1.803 m (5' 11" ) Comment: per pt   Wt 123.3 kg (271 lb 12.8 oz)    SpO2 97%    BMI 37.91 kg/m       PHYSICAL EXAM:  Physical Exam  Physical Exam  Constitutional:       Appearance: Normal appearance.   HENT:      Head: Normocephalic and atraumatic.   Eyes:      Extraocular Movements: Extraocular movements intact.      Conjunctiva/sclera: Conjunctivae normal.      Pupils: Pupils are equal, round, and reactive to light.   Cardiovascular:      Comments: Well perfused  Pulmonary:      Effort: Pulmonary effort is normal. No respiratory distress.   Musculoskeletal:      Cervical back: Normal range of motion.   Skin:     Findings: No lesion.   Neurological:      General: No focal deficit present.      Mental Status: Alert and  oriented to person, place, and time.   Psychiatric:         Mood and Affect: Mood normal.         Thought Content: Thought content normal.          Assessment:    Nicholas Page was seen today for follow-up and welcome to medicare visit.    Diagnoses and all orders for this visit:    Preventative health care    Need for pneumococcal vaccination  -     Pneumococcal 20-valent conj vaccine    Positive colorectal cancer screening using Cologuard test  -     AMB REFERRAL TO GASTROENTEROLOGY    Lumbar disc disease  -     * Spine lumbar standard AP and Lateral views; Future  -     AMB REFERRAL TO ORTHOPEDIC SURGERY    Numbness in feet  -     * Spine lumbar standard AP and Lateral views; Future  -     AMB REFERRAL TO ORTHOPEDIC SURGERY    Tinea cruris  -     ketoconazole (NIZORAL) 2 % cream; Apply topically daily  to the following areas: groin  -     nystatin (NYSTATIN) powder; Apply topically 3 times daily  to the following areas: groin    Hypertension, unspecified type  -     Comprehensive metabolic panel; Future  -     Lipid Panel (Reflex to Direct  LDL if Triglycerides more than 400); Future    Hyperlipidemia, mixed  -     Comprehensive metabolic panel; Future  -     Lipid Panel (Reflex to Direct  LDL if Triglycerides more than 400); Future       .      Plan:      Preventative Health  - Vaccine hx - considering shingles vaccine  as well as flu vaccine   - Prevnar 20 given today  - COVID-19 vaccine - Moderna x2; plus a booster   - Encouraged to get COVID booster  - Referral for Colonoscopy given positive Cologuard    HTN   HLD  - BP at goal  - Repeat CMP and lipid panel ordered  - Continue Norvasc 5 mg daily, Lipitor 40 mg daily    GERD  - At goal  - Continue Protonix      Follow up - 6 months or sooner PRN    Dorina Hoyer MD  [facility]        Visit performed as:        Office Visit, met with patient in person    Today we reviewed and updated Nicholas Page smoking status, activities of daily living,  depression screen, fall risk, medications and allergies.   I have counseled the patient in the above areas.     Subjective:     Chief Complaint: Nicholas Page is a 65 y.o. male here for a/an Follow-up (6 month) and Welcome to Medicare visit    In general, Nicholas Page rates their overall health as:  fair    Diet:  healthy balanced diet    Exercise:  exercising minimally    Patient Care Team:  Dorina Hoyer, MD as PCP - General (Primary Care)     Current Outpatient Medications on File Prior to Visit   Medication Sig Dispense Refill    psyllium (METAMUCIL) 95 % packet Take 1 packet by mouth daily      amLODIPine (NORVASC) 5 mg tablet TAKE 1 TABLET (5 MG TOTAL) BY MOUTH DAILY. 90 tablet 1    pantoprazole (PROTONIX) 40 mg EC tablet Take 1 tablet (40 mg total) by mouth daily  Swallow whole. Do not crush, break, or chew. 90 tablet 1    atorvastatin (LIPITOR) 40 mg tablet Take 1 tablet (40 mg total) by mouth daily 90 tablet 3    aspirin-acetaminophen-caffeine (EXCEDRIN MIGRAINE) 250-250-65 mg per tablet Take 1 tablet by mouth every 8 hours as needed for Pain       No current facility-administered medications on file prior to visit.     Allergies   Allergen Reactions    Ibuprofen Hives     Patient Active Problem List    Diagnosis Date Noted    Hypertension     GERD (gastroesophageal reflux disease)     Hemorrhoids     Lumbar disc disease 08/13/2020    Constipation 08/13/2020    Chest pain 08/13/2020     Past Medical History:   Diagnosis Date    Chronic headaches     GERD (gastroesophageal reflux disease)     Hemorrhoids     Hypertension     Lumbar disc disease      Past Surgical History:   Procedure Laterality Date    LUMBAR LAMINECTOMY      TONSILLECTOMY       Family History   Problem Relation Age of Onset    Cancer Sister      Social History     Socioeconomic History    Marital status: Divorced     Spouse name: Not on file    Number of children: Not on file    Years of education: Not on file     Highest education level: Not on file   Occupational History    Not on file   Tobacco  Use    Smoking status: Former Smoker     Quit date: 12/28/2019     Years since quitting: 1.3    Smokeless tobacco: Never Used   Substance and Sexual Activity    Alcohol use: Yes     Alcohol/week: 7.0 standard drinks     Types: 7 Cans of beer per week    Drug use: Never    Sexual activity: Not Currently     Birth control/protection: None   Social History Narrative    Not on file       Objective:     Vital Signs: BP 118/64 (BP Location: Right arm, Patient Position: Sitting, Cuff Size: adult)    Pulse 63    Temp 36.2 C (97.1 F) (Temporal)    Ht 1.803 m (5' 11" ) Comment: per pt   Wt 123.3 kg (271 lb 12.8 oz)    SpO2 97%    BMI 37.91 kg/m    BMI: Body mass index is 37.91 kg/m.    Vision Screening Results (Welcome visit only):  No exam data present  EKG Results:  n/a    Depression Screening Results:  Recent Review Flowsheet Data     PHQ Scores 05/01/2021 10/31/2020 09/17/2020    PHQ Calculated Score 0 0 0        Opioid Use/DAST- 10 Screening Results:   No data recorded  Activities of Daily Living/Functional Screening Results:  Is the person deaf or does he/she have serious difficulty hearing?: N  Is this person blind or does he/she have serious difficulty seeing even when wearing glasses?: N  *Vision Status: Visual aid   Does this person have serious difficulty walking or climbing stairs?: Y (Uses railing.)  *Walks in Home: Independent  *Climbing Stairs: Needs Assistance (uses railing)  Does this person have difficulty dressing or bathing?: Y (just hurts when putting socks)  *Dressing: Independent  *Bathing: Independent  *Shopping: Independent  *House Keeping: Independent  *Managing Own Medications: Independent  *Handling Finances: Independent  If you need help, who helps you?: Graceann Congress  Difficulty doing errands due to a physicial, mental or emotional condition: No  Difficulty remembering or making decisions due to a physicial,  mental or emotional condition: Yes (sometimes forgetful nothing abnormal)      Fall Risk Screening Results:  Have you fallen in the last year?: No  Do you feel you are at risk for falling?: No      Assessment and Plan:     Cognitive Function:  Recall of recent and remote events appears:  Abnormal - slight issues with recall at times      Advanced Care Planning:  was discussed and patient received paperwork to review     The following health maintenance plan was reviewed with the patient:    Health Maintenance Topics with due status: Overdue       Topic Date Due    HIV Screening USPSTF/[place] Never done    Hepatitis C Screening USPSTF/[place] Never done    IMM-PREVNAR VACCINE 65 + YRS 08/23/2019    IMM-PNEUMOVAX VACCINE 80 + YRS Never done    AAA Screening USPSTF Never done    IMM-ZOSTER 12/26/2020    COVID-19 Vaccine 01/22/2021     Health Maintenance Topics with due status: Not Due       Topic Last Completion Date    IMM-INFLUENZA 10/31/2020    Colon Cancer Screening USPSTF 11/18/2020    DEPRESSION SCREEN YEARLY 05/01/2021    Fall Risk Screening 05/01/2021  This health maintenance schedule, identified risks, a list of orders placed today and patient goals have been provided to Nicholas Page in the after visit summary.     Plan for any concerns identified during screening or risk assessments:    Preventative Health  - Vaccine hx - considering shingles vaccine as well as flu vaccine   - Prevnar 20 given today  - COVID-19 vaccine - Moderna x2; plus a booster   - Encouraged to get COVID booster  - Referral for Colonoscopy given positive Cologuard    Dorina Hoyer MD  [facility]

## 2021-05-01 NOTE — Patient Instructions (Signed)
Thank you for completing your Follow-up (6 month) and Welcome to Medicare visit   with [place] today.     The purpose of this visits was to:     Screen for disease   Assess risk of future medical problems   Help develop a healthy lifestyle   Update vaccines   Get to know your doctor in case of an illness    Patient Care Team:  Dorina Hoyer, MD as PCP - General (Primary Care)     Medicare 5 Year Plan    The following items were identified as areas of concern during your screening today:  High Blood Pressure (Hypertension) - This is a risk factor for Heart Attack, Stroke, Kidney Problems and Eye Problems.   BMI greater than 25 - This is a risk for Heart Attack, Stroke, High Blood Pressure, Diabetes, High Cholesterol and other complications.       The Health Maintenance table below identifies screening tests and immunizations recommended by your health care team:  Health Maintenance: These screening recommendations are based on USPSTF, [employer], and [place] state guidelines   Topic Date Due    HIV Screening  Never done    Hepatitis C Screening  Never done    Adult Prevnar Vaccination (1) Never done    Adult Pneumovax Vaccine (1) Never done    Abdominal Aortic Aneurysm Screening  Never done    Shingles Vaccine (2 of 2) 12/26/2020    COVID-19 Vaccine (4 - Booster for Moderna series) 01/22/2021    Flu Shot (1) 05/29/2021    DEPRESSION SCREEN YEARLY  05/01/2022    Fall Risk Screening  05/01/2022    Colon Cancer Screening  11/19/2023     In addition, goals and orders placed to address these recommendations are listed in the "Today's Visit" section.    We wish you the best of health and look forward to seeing you again next year for your Annual Medicare Wellness Visit.     If you have any health care concerns before then, please do not hesitate to contact us.

## 2021-05-26 ENCOUNTER — Ambulatory Visit
Admission: RE | Admit: 2021-05-26 | Discharge: 2021-05-26 | Disposition: A | Discharge status: Home / Self Care | Payer: Medicare Other | Source: Ambulatory Visit

## 2021-05-26 DIAGNOSIS — R200 Anesthesia of skin: Secondary | ICD-10-CM

## 2021-05-26 DIAGNOSIS — M519 Unspecified thoracic, thoracolumbar and lumbosacral intervertebral disc disorder: Secondary | ICD-10-CM

## 2021-05-27 NOTE — Result Encounter Note (Signed)
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  I would recommend being seen by the orthopedic spine doctors.  Would you like a referral to them placed at this time?    Dorina Hoyer MD  [facility]

## 2021-06-20 ENCOUNTER — Ambulatory Visit: Payer: Medicare Other | Admitting: Orthopedic Surgery

## 2021-06-20 ENCOUNTER — Encounter: Payer: Self-pay | Admitting: Orthopedic Surgery

## 2021-06-20 VITALS — BP 159/76 | HR 62 | Ht 71.0 in | Wt 271.0 lb

## 2021-06-20 DIAGNOSIS — M519 Unspecified thoracic, thoracolumbar and lumbosacral intervertebral disc disorder: Secondary | ICD-10-CM

## 2021-06-20 NOTE — Progress Notes (Addendum)
CC: Bilateral foot numbness status post lumbar spine surgery.    HPI:  Nicholas Page is a 65 y.o. male seen in consultation from Dr. Dorina Hoyer, MD for bilateral foot numbness status post lumbar spine surgery.  Patient has a history of back pain and lumbar disc injury.  He states he had surgeries in 2007 and 2008 in [place], [place].  He states these were done by Dr. Garnetta Buddy.  The patient states after surgery his left foot has been persistently numb.  He describes a weakness in his left leg.  He states about 4 years ago he noted right leg numbness starting.  Specifically right foot numbness.  He states he really does not have much pain.  He is here to see if there is anything to be done about the numbness.  He takes Excedrin daily for pain.  He denies any fever or chills.  He denies any changes in his bowels or bladder.  The patient is retired from Land work.      Past Medical History:   Diagnosis Date    Chronic headaches     GERD (gastroesophageal reflux disease)     Hemorrhoids     Hypertension     Lumbar disc disease        Past Surgical History:   Procedure Laterality Date    LUMBAR LAMINECTOMY      TONSILLECTOMY           Current Outpatient Medications:     psyllium (METAMUCIL) 95 % packet, Take 1 packet by mouth daily, Disp: , Rfl:     ketoconazole (NIZORAL) 2 % cream, Apply topically daily  to the following areas: groin, Disp: 15 g, Rfl: 11    nystatin (NYSTATIN) powder, Apply topically 3 times daily  to the following areas: groin, Disp: 15 g, Rfl: 3    amLODIPine (NORVASC) 5 mg tablet, TAKE 1 TABLET (5 MG TOTAL) BY MOUTH DAILY., Disp: 90 tablet, Rfl: 1    pantoprazole (PROTONIX) 40 mg EC tablet, Take 1 tablet (40 mg total) by mouth daily  Swallow whole. Do not crush, break, or chew., Disp: 90 tablet, Rfl: 1    atorvastatin (LIPITOR) 40 mg tablet, Take 1 tablet (40 mg total) by mouth daily, Disp: 90 tablet, Rfl: 3    aspirin-acetaminophen-caffeine (EXCEDRIN MIGRAINE) 250-250-65 mg  per tablet, Take 1 tablet by mouth every 8 hours as needed for Pain, Disp: , Rfl:     Allergy History as of 06/20/21     IBUPROFEN       Noted Status Severity Type Reaction    08/13/20 [address] [place], [place] 08/13/20 Active  Allergy Hives                Family History   Problem Relation Age of Onset    Cancer Sister        Social History     Socioeconomic History    Marital status: Divorced     Spouse name: Not on file    Number of children: Not on file    Years of education: Not on file    Highest education level: Not on file   Occupational History    Not on file   Tobacco Use    Smoking status: Former Smoker     Quit date: 12/28/2019     Years since quitting: 1.4    Smokeless tobacco: Never Used   Substance and Sexual Activity    Alcohol use: Yes  Alcohol/week: 7.0 standard drinks     Types: 7 Cans of beer per week    Drug use: Never    Sexual activity: Not Currently     Birth control/protection: None   Social History Narrative    Not on file         ROS:  12 point review of systems is positive for the following symptoms: Headaches, GERD, hypertension and MSK.    The allergies, medications, past medical history, past surgical history, family history, and social history have all been reviewed by me at this visit and documented in erecord.    PE:  65 y.o. male well nourished and well developed in no acute distress.  Examination of the bilateral upper extremities demonstrates 5 out of 5 strength in the deltoids biceps triceps wrist extension wrist flexion and hand intrinsics. Pt has intact sensation to light touch in the C5 and T1 distribution. Pt has 2+ radial pulse and 2+ reflexes.  Negative Hoffman's sign.  Examination of the bilateral lower extremities demonstrates 5 out of 5 strength in the iliopsoas quadriceps tibialis anterior gastrocsoleus EHL.  The patient has 4 out of 5 strength in the EHL on the left.  Pt has intact sensation to light touch in the L1-S1 distribution . 2+ dorsalis pedis 2+  reflexes.    Imaging:  I personally reviewed the lumbar spine x-rays done on 05/26/2021 which showed, per radiology report; mild loss of lumbar lordosis.  Marginal osteophytes noted and involve L1-L2, L3-L4, and L5-S1.  There is loss of disc space at L5-S1.  Facet joint sclerosis noted at L3-L4, L4-L5 and L5-S1.  Findings are consistent with moderately severe degenerative spinal arthrosis.    Assessment:  65 y.o. male with bilateral foot numbness status post lumbar spine surgery.    Plan:  1.  Patient referred to physical therapy.    2.  Encourage patient to continue pain medication as previously prescribed.    3.  Follow-up with Aleda Grana NP in 6 weeks.  Patient's pain is persistent at this time, will consider MRI of the lumbar spine.  Answers for HPI/ROS submitted by the patient on 06/17/2021  What is your goal for today's visit?: diagnosis for numbness in both feet  Date of onset: : 02/24/2018  Was this the result of an injury?: No  What is your pain level?: 4/10  Please describe the quality of your pain: : discomfort, numbness, spasm, tingling  What diagnostic workup have you had for this condition?: X-ray  What treatments have you tried for this condition?: aspirin  Progression since onset: : unchanged since onset  Is this a work related condition? : No  Current work status: : no work  Fever: No  Chills: No  Weight loss: No  Malaise/fatigue: Yes  Rash: No  Dry skin: No  Itching: Yes  Wound : No  Headache: No  Hearing loss: No  Sore throat: No  Dental problem: Yes  Blindness: No  Visual disturbance: No  Chest pain: No  Palpitations: No  Leg cramps with exercise: No  Leg swelling: No  Fainting: No  Heartburn: No  Nausea: No  Vomiting: No  Diarrhea: No  Constipation: Yes  Blood in stool: Yes  Cough : No  Apnea: No  Shortness of breath: No  Frequency: No  Urgency: No  Blood in urine: No  Incomplete emptying: No  Falls: No  Neck pain: No  Back pain: Yes  Joint swelling: No  Muscle spasms: No  Muscle  cramps: No  Easy  to bruise/bleed: No  Frequent infections: No  Numbness: Yes  Tingling: Yes  Seizures: No  Loss of consciousness: No  Weakness: Yes  Depression: No  Nervous/anxious: No  Memory loss: Yes

## 2021-07-08 NOTE — Progress Notes (Signed)
Sent via: eRecord EMR     Physician attestation for Plan of Care: Physician:  Aleda Grana, NP  Per signature, I have reviewed and agree with the documented plan of care.    Signature: ____________________________________________________  ____________________________________________________________  Plan of Care     The [id number] co-signature on this note indicates that they have reviewed this evaluation and agree with the documented goals and plan of care.            [facility]  Lumbar Spine Initial Evaluation      Name: Nicholas Page  DOB: 12-30-1955  Referring Physician: Aleda Grana, NP  Todays Date: 07/09/2021  Visit #: 1    Diagnosis:     ICD-10-CM ICD-9-CM   1. Lumbar disc disease  M51.9 722.93       Subjective    Nicholas Page is a 65 y.o. male who is present today for lumbar spine care. Pt has good and bad days, and needs to wear a back brace on bad days (every 3 weeks). Pt says he originally injured his back when he was 26 and catching in a softball game (bending and reaching). He had pain and repetitive injuries over the years since that incident, and the "disc blew" when he was 26 and again when he was 59. They did not do surgery but prescribed bed rest. 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 He eventually had surgery (discectomy), and a second surgery for the same thing without a fusion (around 20 years ago). He has always had numbness on the L side of his foot since one of his initial injuries, but notes R foot numbness over the last 4 years with no atrophy or foot drop, and the L foot is now completely numb. Pt wants to see if PT can help with the numbness. His back pain comes and goes but he always has some pain and adjusts his activities accordingly. Pt notes no change in numbness regardless of position, but numbness sometimes worsens when feet  are elevated or cold.     Onset date: 30 years ago  Mechanism of injury/history of symptoms:  Episodes of severe pain. Numbness over the last 20 years.   Symptoms worsen with: sometimes sleeping on side  Symptoms improve with: back pain calms with aspirin and rest  Prior therapy: at various times, PT and chiropractor, no lasting effect but pt says most of the therapy was passive  Diagnostic tests: x-rays (copied from chart):   "There is mild loss of the usual lumbar lordosis and this may be due to muscle spasm. Marginal osteophytes are present and involve L1-L2, L3-L4 and L5-S1. There is loss of disc space at L5-S1. Facet joint sclerosis is present at L3-L4, L4-L5 and L5-S1.   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   Relevant Red Flags: none    Social History   Living situation and Home Environment: lives with sister and brother-in-law, cares for  animals, stairs at home but has elevator too if needed.   Prior level of function: chronic pain and numbness in L foot  Pt. Stated goals: to get rid of numbness     Occupation and Activities  Work status: Retired  Sport(s)/Hobbies: online games    Past Medical History   Surgical history:   Past Surgical History:   Procedure Laterality Date    LUMBAR LAMINECTOMY      TONSILLECTOMY       Current Medications: Current medications reviewed with patient by evaluating therapist and potential effects on patient participation/outcome for therapy.    Relevant Comorbidities/Personal Factors:    Past Medical History:   Diagnosis Date    Chronic headaches     GERD (gastroesophageal reflux disease)     Hemorrhoids     Hypertension      Lumbar disc disease    Cardiac arrhythmia per pt report, not followed by cardiology at this point.      These are relevant factors as they: may affect a Pt's ability to exercise and response exercise, thus impacting treatment outcome and plan of care.      Outcome Measurement   Oswestry 20%   of perceived disability    Objective  Blood pressure: 145/78  Heart Rate: 58  SpO2: 98   Posture: Right lateral shift  Incision:  NA  Gait: decreased stride length    Neurological:  Myotomes MMT x/5, * denotes pain   Right Left   L1,2 - Psoas 4- 3+   L3 - Quadriceps 4 5   L4 - Anterior Tibialis 5 5 (full AROM)   L5 - EHL/Peroneals 5 5   S1, 2 - Gastroc/Hamstrings 5 hamstrings, able to complete heel raise on R 5 hamstrings, unable to complete heel raise on L     Dermatomes   Right Left   L1 Normal Normal   L2 Normal Normal   L3 Normal Hyposensate   L4 Hyposensate Hyposensate   L5 Hyposensate Hyposensate   S1 Normal Normal       Lumbar Spine AROM Unit of Measure   Flexion 75 % ability   Extension 0 (compensates with knees) % ability   Right Sidebending 50* % ability   Left Sidebending 50* % ability   Right Rotation 50 % ability   Left Rotation 50 % ability     Lumbar Special Testing: Negative FABER, FADIR, SLR B      Repeated Motions: Prior signs and symptoms: numbness in B feet  Movement Rep Response   Double KTC 10 No change in LEs   Prone on elbows 10 No change   R Side glide at wall  10 No change   L side glide at wall 10 No change          Assessment:  Findings consistent with 65 y.o., male with h/o L lateral foot numbness and gastroc atrophy after a disc injury roughly 20 years ago. He has 5-yr h/o R foot numbness and worsening L foot numbness in his entire foot, which is worse with cold and with elevation. Numbness was not influenced at all by any spinal movements, SLR, or repeated motions today, and numbness is no longer along any particular nerve distribution and is more global than the initial L LE radiculopathy. These  findings indicate that at this point the symptoms are not being directly influenced by the spine. I do believe this patient would benefit from testing for a possible diagnosis of neuropathy, and recommend  a referral to neurology for further consultation. Based on testing today, it is not likely that PT will be able to address the numbness. Pt does present with decreased lumbar ROM but feels he manages his back pain well enough at this time and does not want to participate in formal PT for his back at this time if we will not be able to address the numbness.     A history with 1-2 personal factors and/or comorbidities that impact the plan of care.  An examination of body system(s) using standardized test and measures addressing 3-4 elements from any of the following: body structures and functions, activity limitations and/or participation restrictions.  A clinical presentation with stable and/or uncomplicated characteristics.  A Low Complexity evaluation was performed today.    Prognosis: poor for numbness  Contraindications/Precautions/Limitation: none    Treatment Plan:   Patient is referred for further testing by neurologist as deemed appropriate by PCP and Ortho. After discussing options with the patient, formal PT will not be conducted at this time.     Treatment today consisted of:  Evaluation and discussing pertinent anatomy and options for PT, referrals    Thank you for referring this patient to [facility].    Verdia Kuba, PT,DPT       Time Reporting Minutes   Service-Based Procedures/ Modalities    Evaluation (high, moderate, low) 60   Re-evaluation    Traction, mechanical    Electric stimulation (unattended)    Total service-based billable procedures 60       Time-Based  Procedures / Modalities    Therapeutic ex    Neuromuscular Re-ed    Manual Therapy    Therapeutic Activities    Gait training, including stairs    Ultrasound    Electrical Stimulation     Iontophoresis    Physical Performance Test    Aquatic Therapy    Total Time-Based Billable Procedures        Total Skilled Treatment Time 60

## 2021-07-09 ENCOUNTER — Ambulatory Visit: Payer: Medicare Other | Attending: Orthopedic Surgery

## 2021-07-09 ENCOUNTER — Other Ambulatory Visit: Payer: Self-pay

## 2021-07-09 DIAGNOSIS — M519 Unspecified thoracic, thoracolumbar and lumbosacral intervertebral disc disorder: Secondary | ICD-10-CM | POA: Insufficient documentation

## 2021-08-01 ENCOUNTER — Other Ambulatory Visit: Payer: Self-pay

## 2021-08-01 ENCOUNTER — Ambulatory Visit: Payer: Medicare (Managed Care) | Admitting: Orthopedic Surgery

## 2021-08-01 ENCOUNTER — Encounter: Payer: Self-pay | Admitting: Orthopedic Surgery

## 2021-08-01 VITALS — BP 148/73 | HR 61

## 2021-08-01 DIAGNOSIS — M519 Unspecified thoracic, thoracolumbar and lumbosacral intervertebral disc disorder: Secondary | ICD-10-CM

## 2021-08-01 MED ORDER — GABAPENTIN 300 MG PO CAPSULE *I*
300.0000 mg | ORAL_CAPSULE | Freq: Every evening | ORAL | 1 refills | Status: DC
Start: 2021-08-01 — End: 2021-10-06

## 2021-08-01 NOTE — Progress Notes (Signed)
Follow-up visit note: Lumbar disc disease.     Nicholas Page is a 65 y.o. male here for follow up of low back with referred pain into lower extremities bilaterally.  Today, patient states that his lumbar back pain is relatively unchanged from her last visit on 06/20/2021.  He went to be evaluated by physical therapy on 07/09/2021 and after there physical assessment, he was determined not to a appropriate candidate for therapy due to the fact that his B/L foot numbness is not precipitated by any particular spine movements, SLR and that radiculopathy does not lie along any particular nerve distribution and appears to be more global than his initial left lower extremity radiculopathy per Nicholas Page PT.  At that time, PT stated that this patient went particular benefit from PT due to their inability to address patient's reported numbness and that he should follow-up with neurology.  Patient does not endorse any other new concerns or issues at this time.    Since last visit his symptoms have remained the same.       Conservative treatment: physical therapy.     Medicine management: Excedrin    Answers for HPI/ROS submitted by the patient on 07/29/2021  What is your goal for today's visit?: Options  Was this the result of an injury?: Yes  What is your pain level?: 3/10  Please describe the quality of your pain: : numbness, tingling  What diagnostic workup have you had for this condition?: X-ray  What treatments have you tried for this condition?: aspirin, physical therapy  Progression since onset: : unchanged since onset  Is this a work related condition? : No  Current work status: : no work  Fever: No  Chills: No  Numbness: Yes  Tingling: Yes      Pain    08/01/21 1308   PainSc:   0 - No pain         Current Outpatient Medications   Medication    psyllium (METAMUCIL) 95 % packet    ketoconazole (NIZORAL) 2 % cream    nystatin (NYSTATIN) powder    amLODIPine (NORVASC) 5 mg tablet    pantoprazole (PROTONIX) 40 mg EC  tablet    atorvastatin (LIPITOR) 40 mg tablet    aspirin-acetaminophen-caffeine (EXCEDRIN MIGRAINE) 250-250-65 mg per tablet    gabapentin 300 mg capsule     No current facility-administered medications for this visit.       Allergies   Allergen Reactions    Ibuprofen Hives         Vitals:    08/01/21 1308   BP: 148/73   Pulse: 61     Physical examination:    He  is a healthy and well-appearing 65 y.o. male.     Ambulation: antalgic gait without assistance.    Range of motion of lumbar spine: limited flexion, limited extension, limited left lateral rotation and limited right lateral rotation.     Palpation: Posterior elements of the lumbar spine are non-tender.         Neurological Examination                          MOTOR (0-5) RIGHT LEFT   REFLEXES RIGHT LEFT   L2 (Hip flexion) 5 4   Patella (L4) 2+ 2+   L3 (Knee extension) 5 4   Achilles (S1) 2+ Diminished   L4 (Ankle dorsiflexion) 5 4        L5 (Extensor hallucis  longus) 5 4        S1 (Ankle plantarflexion/eversion) 5 4                                                                       Sensation is intact to light touch from L3-S1 bilaterally.    Full painless range of motion of the knees and hips bilaterally.     Review of imaging studies: no new imaging studies taken today.      Assessment and plan: Assessment and Plan: Nicholas Page is a 65 y.o. male with   1. Lumbar disc disease  MR lumbar spine without contrast    EMG        Discussed anatomy and pathophysiology of lumbar disc disease.  No new imaging was obtained today and all patient's questions and concerns were answered.  We discussed that even though PT did not think that therapy would be beneficial for his B/L foot numbness due physical exam testing but does not correlate patient's symptomology as originating from the spine.  We discussed that before referring patient to see neurology regarding his B/L foot numbness that a B/L lower extremity EMG nerve conduction testing should be completed to  rule out any cause for patient's B/L radicular leg symptoms along with his associated foot numbness.  Additionally, a lumbar MRI without contrast was ordered for this patient as well to further assess patient's lumbar spine for any nerve pathology.  Lastly, patient was prescribed gabapentin 300 mg to be taken once nightly to see if this will help with the B/L lower extremity radiculopathy with associated foot numbness.  Patient will follow-up in clinic after his lower extremity EMG and lumbar MRI have been performed and results of been finalized.    I have answered all of his question to his satisfaction. He is agreeable to the treatment plan. He will follow up in 6 weeks.

## 2021-08-03 ENCOUNTER — Ambulatory Visit
Admission: RE | Admit: 2021-08-03 | Discharge: 2021-08-03 | Disposition: A | Discharge status: Home / Self Care | Payer: Medicare (Managed Care) | Source: Ambulatory Visit | Attending: Orthopedic Surgery | Admitting: Orthopedic Surgery

## 2021-08-03 DIAGNOSIS — M4807 Spinal stenosis, lumbosacral region: Secondary | ICD-10-CM | POA: Insufficient documentation

## 2021-08-03 DIAGNOSIS — M48061 Spinal stenosis, lumbar region without neurogenic claudication: Secondary | ICD-10-CM | POA: Insufficient documentation

## 2021-08-03 DIAGNOSIS — M519 Unspecified thoracic, thoracolumbar and lumbosacral intervertebral disc disorder: Secondary | ICD-10-CM

## 2021-08-03 DIAGNOSIS — M47817 Spondylosis without myelopathy or radiculopathy, lumbosacral region: Secondary | ICD-10-CM | POA: Insufficient documentation

## 2021-08-03 DIAGNOSIS — M47816 Spondylosis without myelopathy or radiculopathy, lumbar region: Secondary | ICD-10-CM | POA: Insufficient documentation

## 2021-08-03 DIAGNOSIS — M5127 Other intervertebral disc displacement, lumbosacral region: Secondary | ICD-10-CM | POA: Insufficient documentation

## 2021-08-08 ENCOUNTER — Other Ambulatory Visit: Payer: Self-pay | Admitting: Family Medicine

## 2021-08-08 DIAGNOSIS — K219 Gastro-esophageal reflux disease without esophagitis: Secondary | ICD-10-CM

## 2021-08-12 ENCOUNTER — Encounter: Payer: Self-pay | Admitting: Physical Medicine and Rehabilitation

## 2021-08-12 ENCOUNTER — Ambulatory Visit: Payer: Medicare (Managed Care) | Admitting: Physical Medicine and Rehabilitation

## 2021-08-12 ENCOUNTER — Other Ambulatory Visit: Payer: Self-pay

## 2021-08-12 VITALS — Ht 71.5 in | Wt 271.0 lb

## 2021-08-12 DIAGNOSIS — M519 Unspecified thoracic, thoracolumbar and lumbosacral intervertebral disc disorder: Secondary | ICD-10-CM

## 2021-08-12 DIAGNOSIS — G629 Polyneuropathy, unspecified: Secondary | ICD-10-CM

## 2021-08-12 NOTE — Procedures (Signed)
Electrodiagnostic Medicine Evaluation     ELECTRODIAGNOSTIC IMPRESSION:  1.       Reason for study: ***     HPI:  65 y.o. male who is referred for an electrodiagnostic study to evaluate bilateral foot numbness.  The patient was referred by Kelli Churn, NP within the orthopedics department.  Notably, he has a remote history of a lumbar disc herniation with low back pain, as well as multiple lumbar spine surgical procedures completed in [place] in 2007 in 2008.  He is here today with complaints of bilateral dorsal and plantar foot numbness.  He reports that the left side began after his prior "sciatica" episode and eventual lumbar spine surgery. Since then, he has had symptoms of foot numbness and ankle plantarflexion weakness.  About 3 to 4 years ago, he developed acute onset numbness in the right foot as well, without specific inciting injury. There is no weakness reported in the right foot. He denies any complaints of back pain today.     MRI of the lumbar spine completed 08/03/2021 shows L5-S1 posterior central disc herniation with mild spinal stenosis and moderate bilateral recess stenosis, bilateral facet hypertrophy causing left S1 compression and bilateral neuroforaminal narrowing with L5 nerve root compression bilaterally.    Physical Examination  There were no vitals taken for this visit.  Constitutional: Alert and not in acute distress.  Well developed and well nourished.  Neurological:  Muscle Testing:    Side Hip flexion Knee Ext Dorsiflexion Inversion Eversion EHL FHL Plantarflex   R 5 5 5 5 5 5 5 5    L 5 5 5 5 5 5 5 4      Sensory:  Sensation diminished to light touch in the dorsal and plantar aspects of both feet.   Reflexes: 2+ bilaterally at the patella and absent at the bilateral achilles  Musculoskeletal:  No atrophy, cyanosis, gross deformity of involved extremities. Bilateral pitting edema of the lower limbs  Skin:  No skin lesions were noted in the areas studied today.  No rash or  erythema.  Psychiatric: Normal mood and affect.        Procedure explained and informed consent was obtained.  No contraindications to NCS and/or needle EMG were noted.

## 2021-08-14 DIAGNOSIS — G629 Polyneuropathy, unspecified: Secondary | ICD-10-CM | POA: Insufficient documentation

## 2021-08-15 ENCOUNTER — Encounter: Payer: Self-pay | Admitting: Orthopedic Surgery

## 2021-08-15 NOTE — Progress Notes (Signed)
CC:  Bilateral foot numbness status post lumbar spine surgery.    HPI:  Nicholas Page is a 65 y.o. male presents in follow up regarding the above symptoms. Today, patient states that his lumbar back pain is relatively unchanged from her last visit on 06/20/2021.  He went to be evaluated by physical therapy on 07/09/2021 and after there physical assessment, he was determined not to a appropriate candidate for therapy due to the fact that his B/L foot numbness is not precipitated by any particular spine movements, SLR and that radiculopathy does not lie along any particular nerve distribution and appears to be more global than his initial left lower extremity radiculopathy per Nicholas Page PT.  At that time, PT stated that this patient went particular benefit from PT due to their inability to address patient's reported numbness and that he should follow-up with neurology.  Patient does not endorse any other new concerns or issues at this time.    Physical examination:  He appears to be well and in no acute distress. He ambulates without assistance.  Examination of the bilateral upper extremities demonstrates 5 out of 5 strength in the deltoids biceps triceps wrist extension wrist flexion and hand intrinsics. Pt has intact sensation to light touch in the C5 and T1 distribution. Pt has 2+ radial pulse and 2+ reflexes.  Negative Hoffman's sign.  Examination of the bilateral lower extremities demonstrates 5 out of 5 strength in the iliopsoas quadriceps tibialis anterior gastrocsoleus EHL. Pt has intact sensation to light touch in the L1-S1 distribution . 2+ dorsalis pedis 2+ reflexes.  Gait steady.      Assessment: [name] with Bilateral foot numbness status post lumbar spine surgery.    Plan:  1.  Patient referred for MRI of the lumbar spine.    2.  Patient started on gabapentin 300 mg at bedtime.  Discussed gabapentin with patient.    3.  Patient referred for EMG studies of the lower extremities.    4.  Follow up with  Aleda Grana NP in 6 weeks.  Answers for HPI/ROS submitted by the patient on 07/29/2021  What is your goal for today's visit?: Options  Was this the result of an injury?: Yes  What is your pain level?: 3/10  Please describe the quality of your pain: : numbness, tingling  What diagnostic workup have you had for this condition?: X-ray  What treatments have you tried for this condition?: aspirin, physical therapy  Progression since onset: : unchanged since onset  Is this a work related condition? : No  Current work status: : no work  Fever: No  Chills: No  Numbness: Yes  Tingling: Yes

## 2021-08-28 ENCOUNTER — Encounter: Payer: Self-pay | Admitting: Family Medicine

## 2021-09-05 ENCOUNTER — Other Ambulatory Visit: Payer: Self-pay | Admitting: Family Medicine

## 2021-09-05 DIAGNOSIS — E782 Mixed hyperlipidemia: Secondary | ICD-10-CM

## 2021-09-08 ENCOUNTER — Encounter: Payer: Self-pay | Admitting: Orthopedic Surgery

## 2021-09-08 ENCOUNTER — Telehealth: Payer: Self-pay

## 2021-09-08 NOTE — Telephone Encounter (Signed)
Copied from [place] [id number]. Topic: Appointments - Schedule Appointment  >> Sep 08, 2021  1:19 PM Ginger Carne wrote:  Patient with positive cologuard screening calling to schedule COD. Writer unable to schedule. Message sent to back office to schedule patient.   Patient can be reached at [place] with [name] , patient, regarding scheduling of procedure, details listed below:    Are you on any blood thinners? no   If yes, who prescribes them and what is their phone number?  n/a  Do you have an automated defibrillator? NO    Do you have an LVAD? NO     Do you have a tracheostomy? NO    Do you use BIPAP or oxygen at home? NO    For scheduling safety precautions, is the patient's BMI under 45? Estimated body mass index is 37.27 kg/m as calculated from the following:    Height as of 08/12/21: 1.816 m (5' 11.5").    Weight as of 08/12/21: 122.9 kg (271 lb).    YES        TO ENSURE APPROPRIATE CLEANSE PREPARATION FOR COLONOSCOPY SCHEDULING ONLY:    Are you diabetic? NO    Do you move your bowels daily or every other day? YES  - [facility] WILL BE SENT FOR NON DIABETIC PATIENTS.    Have you been diagnosed with kidney disease? NO    FOR ENDOSCOPY (includes CEN) SCHEDULING ONLY  Have you ever had banding for esophageal varices? NOT APPLICABLE.    NEW** Please double check if the patient has VA benefits they are using for this exam:      No, patient reported, they do not have VA benefits      Patient was scheduled for the following:    Procedure Type: COD  Sedation Type: Moderate  Procedure Location:   Procedure Date:   Arrival time:   Dr.:     Patient was informed Guidance for COVID-19 testing prior to procedure, regardless of vaccination status, continues to be evaluated by [facility]. You will be contacted within 1-2 weeks of your scheduled procedure with instructions regarding current COVID-19 testing protocols.    Patient requested prep instructions sent via:

## 2021-09-11 ENCOUNTER — Encounter: Payer: Self-pay | Admitting: Orthopedic Surgery

## 2021-09-11 ENCOUNTER — Other Ambulatory Visit: Payer: Self-pay

## 2021-09-11 ENCOUNTER — Ambulatory Visit: Payer: Medicare (Managed Care) | Admitting: Orthopedic Surgery

## 2021-09-11 VITALS — BP 135/76 | Ht 71.0 in | Wt 271.0 lb

## 2021-09-11 DIAGNOSIS — M5126 Other intervertebral disc displacement, lumbar region: Secondary | ICD-10-CM

## 2021-09-11 NOTE — Progress Notes (Signed)
CC:  Bilateral foot numbness status post lumbar spine surgery.    HPI:  Nicholas Page is a 65 y.o. male presents in follow up regarding the above symptoms.  Patient states he has not had any changes in his symptoms since his last visit.  He continues to have none's legs.  He does have pain in his low back that radiates down the outside of his left leg to his left foot.  He is describing numbness.  He is taking gabapentin with no relief of pain.   He denies difficulty with his bowels or bladder.  Denies any fever or chills.    Physical examination:  He appears to be well and in no acute distress. He ambulates without assistance.  Examination of the bilateral upper extremities demonstrates 5 out of 5 strength in the deltoids biceps triceps wrist extension wrist flexion and hand intrinsics. Pt has intact sensation to light touch in the C5 and T1 distribution. Pt has 2+ radial pulse and 2+ reflexes.  Negative Hoffman's sign.  Examination of the bilateral lower extremities demonstrates 5 out of 5 strength in the iliopsoas quadriceps tibialis anterior gastrocsoleus EHL. Pt has intact sensation to light touch in the L1-S1 distribution . 2+ dorsalis pedis 2+ reflexes.  Gait steady.      Imaging:  I personally reviewed the lumbar spine done on 08/03/2021 which showed, per radiology report; at L4-L5 there is desiccation of the disc.  There is moderate loss of disc height and broad-based disc bulging.  There is moderate facet arthropathy.  There is no spinal stenosis.  There is bilateral lateral recess stenosis.  There is moderate right foraminal stenosis and mild left foraminal stenosis.  At L5-S1 there is disc desiccation and loss of disc height.  There is a posterior central disc protrusion.  There is bilateral facet hypertrophy.  There is mild spinal stenosis and moderate bilateral recess stenosis.  There is compression on the left S1 nerve root due to disc bulging and facet hypertrophy.  There is moderate bilateral  foraminal stenosis with compression on the L5 nerve root.    EMG studies done by Dr. Tamala Julian which showed an abnormal study.  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    Assessment: Nicholas Page with bilateral foot numbness status post lumbar spine surgery.    Plan:  1.  Discussed possible epidural injections with patient.  He has had this in the past with minimal relief.  He would prefer not to have another injection at this time.    2.  Patient to continue pain medication as previously prescribed.    3.  Follow up with Dr. Tennis Ship at his next available appointment.  Answers for HPI/ROS submitted by the patient on 09/08/2021  What is your goal for today's visit?: see what doctor recommends  Date of onset: : 01/26/2017  Was this the result of an injury?: No  What is your pain level?: 2/10  Please describe the quality of your pain: : numbness  What diagnostic workup have you had for this condition?: MRI scan, X-ray  What treatments have you tried for this condition?: gabapentin  Progression since onset: : unchanged since onset  Is this a work related condition? : No  Current work status: : no work  Fever: No  Chills: No  Numbness: Yes  Tingling: Yes

## 2021-10-02 ENCOUNTER — Ambulatory Visit: Payer: Medicare Other | Admitting: Orthopedic Surgery

## 2021-10-03 ENCOUNTER — Ambulatory Visit: Payer: Medicare (Managed Care) | Admitting: Orthopedic Surgery

## 2021-10-04 ENCOUNTER — Other Ambulatory Visit: Payer: Self-pay | Admitting: Orthopedic Surgery

## 2021-10-05 NOTE — Progress Notes (Deleted)
Name: Nicholas Page    Date Of Birth: Aug 02, 1956    eMRN: [id number]    Referring Physician: Dorina Hoyer, MD  [address]  [address]  [place],  [place] [address]       History of Present Illness:    Nicholas Page is a 66 year old male with a past medical history of GERD and HTN who presents with foot numbness and low back and some left lower extremity pain. He has a history of prior lumbar spine surgeries in 2007 and 2008.        He had an EMG on 08/12/21 which showed:    1. Abnormal study.  2. Electrodiagnostic evidence of a chronic, primarily axonal, sensorimotor, large fiber peripheral polyneuropathy affecting the lower limbs.   3. 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       In total, the patient has undergone at least six weeks of non-operative treatment in the last six months. This non-operative treatment includes steroids, gabapentin, NSAIDs, acetaminophen, muscle relaxants, physical therapy and several epidural injections.  Epidural injection history is as follows: *** He has had multiple previous epidural injections without significant relief.    Today, the pain is ***% in the legs and ***% in the back. The pain is ***% in the right leg and ***% in the left leg.    The patient denies numbness/tingling or weakness ***.    {GNFAOZH:08657}      Past Medical History:   Diagnosis Date    Chronic headaches     GERD (gastroesophageal reflux disease)     Hemorrhoids     Hypertension     Lumbar disc disease          Past Surgical History:   Procedure Laterality Date    LUMBAR LAMINECTOMY      TONSILLECTOMY           Family History   Problem Relation Age of Onset    Cancer Sister          Medications:  Current Outpatient Medications   Medication    atorvastatin (LIPITOR) 40 mg tablet    pantoprazole (PROTONIX) 40 mg EC tablet    gabapentin 300 mg capsule    psyllium (METAMUCIL) 95 % packet     ketoconazole (NIZORAL) 2 % cream    nystatin (NYSTATIN) powder    amLODIPine (NORVASC) 5 mg tablet    aspirin-acetaminophen-caffeine (EXCEDRIN MIGRAINE) 250-250-65 mg per tablet     No current facility-administered medications for this visit.         Allergies:  Allergies   Allergen Reactions    Ibuprofen Hives         Social History     Tobacco Use    Smoking status: Former     Types: Cigarettes     Quit date: 12/28/2019     Years since quitting: 1.7    Smokeless tobacco: Never   Substance Use Topics    Alcohol use: Yes     Alcohol/week: 7.0 standard drinks     Types: 7 Cans of beer per week    Drug use: Never          Physical Examination:  There were no vitals taken for this visit.    GEN: well-appearing, NAD    MOTOR RIGHT:   Hip flexion {VPMotorExam:39846::"5"}/5  Knee extension {VPMotorExam:39846::"5"}/5  Knee flexion {VPMotorExam:39846::"5"}/5  Ankle dorsiflexion {VPMotorExam:39846::"5"}/5  Hallux extension {VPMotorExam:39846::"5"}/5  Ankle plantarflexion {VPMotorExam:39846::"5"}/5    MOTOR LEFT:  Hip flexion {VPMotorExam:39846::"5"}/5  Knee extension {VPMotorExam:39846::"5"}/5  Knee flexion {VPMotorExam:39846::"5"}/5  Ankle dorsiflexion {VPMotorExam:39846::"5"}/5  Hallux extension {VPMotorExam:39846::"5"}/5  Ankle plantarflexion {VPMotorExam:39846::"5"}/5    SENSATION: is normal to light touch in the lower extremities{VPexception:39847}    REFLEXES: *** reflexes in the patellae, ***reflexes in the achilles     Romberg is negative***  Tandem Gait is normal***  ***Able to perform standing leg raise for greater than 10 seconds      Imaging Studies:    The images were personally reviewed by me in clinic.    MRI Lumbar Spine 08/03/21:     L1-2: no significant foraminal or central stenosis; preserved disc height  L2-3: no significant foraminal or central stenosis; preserved disc height  L3-4: no significant foraminal or central stenosis; preserved disc height  L4-5: dark disc, disc bulge, right>left foraminal  stenosis, right facet hypertrophy, no central stenosis  L5-S1: possible previous left L5 laminotomy, bilateral facet hypertrophy, posterior disc herniation resulting in left>right nerve root compression on traversing S1 nerve root, bilateral L>R foraminal stenosis    XR Lumbar Spine 10/09/21:     No visible fracture or dislocation. Normal appearing vertebral bodies with appropriate lumbar lordosis. No significant coronal scoliosis. Evidence of mild facet arthropathy.      Impression/Plan:    Nicholas Page is a 66 year old male who presents with left lower extremity pain, back pain, and foot numbness that may be due to degenerative pathology centered at L5-S1, though he does have evidence of degeneration at L4-5. This is in the setting of prior lumbar spine surgery in 2007 and 2008.    He has failed non-operative treatment. He may benefit from surgical intervention including L5-S1 decompression and instrumented fusion with possible transforaminal interbody fusion. However, I would like to proceed with further diagnostic work-up including CT scan lumbar spine and would like him to provide all of his previous surgical operative notes to better understand his prior lumbar spine surgeries.    If we do proceed with a surgery, I think it is unlikely that his numbness will improve give that he has had bilateral foot numbness for many years.    He will follow up after imaging and retrieving his previous records.    Thank you for this consultation.    _______________________    Diona Browner MD      [facility]  [address], [place]  [place], [place] [address]    Office Phone Number: [phone]  Office Fax Number: [phone]

## 2021-10-06 NOTE — Telephone Encounter (Signed)
Medication refilled

## 2021-10-08 ENCOUNTER — Other Ambulatory Visit: Payer: Self-pay | Admitting: Family Medicine

## 2021-10-08 DIAGNOSIS — I10 Essential (primary) hypertension: Secondary | ICD-10-CM

## 2021-10-09 ENCOUNTER — Ambulatory Visit: Payer: Medicare (Managed Care) | Admitting: Orthopedic Surgery

## 2021-10-09 DIAGNOSIS — M519 Unspecified thoracic, thoracolumbar and lumbosacral intervertebral disc disorder: Secondary | ICD-10-CM

## 2021-11-04 ENCOUNTER — Other Ambulatory Visit: Payer: Self-pay

## 2021-11-04 ENCOUNTER — Ambulatory Visit: Payer: Medicare (Managed Care) | Admitting: Family Medicine

## 2021-11-04 ENCOUNTER — Encounter: Payer: Self-pay | Admitting: Family Medicine

## 2021-11-04 VITALS — BP 126/64 | HR 58 | Temp 97.6°F | Ht 71.0 in | Wt 280.8 lb

## 2021-11-04 DIAGNOSIS — Z0000 Encounter for general adult medical examination without abnormal findings: Secondary | ICD-10-CM

## 2021-11-04 DIAGNOSIS — R195 Other fecal abnormalities: Secondary | ICD-10-CM

## 2021-11-04 DIAGNOSIS — K219 Gastro-esophageal reflux disease without esophagitis: Secondary | ICD-10-CM

## 2021-11-04 DIAGNOSIS — I10 Essential (primary) hypertension: Secondary | ICD-10-CM

## 2021-11-04 DIAGNOSIS — M545 Low back pain, unspecified: Secondary | ICD-10-CM

## 2021-11-04 NOTE — Progress Notes (Signed)
[facility]  [facility]     History and Physical    HISTORY:  Chief Complaint   Patient presents with    Follow-up     6 month       History of Present Illness:    HPI  Nicholas Page is a 66 y.o. male who presents to clinic to for an annual physical.    Lumbar back pain - Was scheduled to see Ortho but is rescheduled for a few weeks.    Positive Cologuard - Noted on recent Cologuard in 10/2020. Advised to get Colonoscopy, which has not yet been completed.    HTN - BP today is126/64. Current regimen includesNorvasc 5 mg daily, Lipitor 40 mg daily.Deniespounding headache, chest pain, dizziness on standing.     GERD -Currentlywell-managed with Protonix 40 mg daily.    Vaccine hx -Last tetanus uncertain; completed shingles vaccine; has had Prevnar 20  COVID-19 vaccine -Moderna x2;plus abooster; has not had bivalent booster  Denies fevers, chills, chest pain, SOB, nausea, vomiting, changes in bowel or bladder habits.    Problems:  Patient Active Problem List   Diagnosis Code    Lumbar disc disease M51.9    Constipation K59.00    Chest pain R07.9    Hypertension I10    GERD (gastroesophageal reflux disease) K21.9    Hemorrhoids K64.9    Peripheral polyneuropathy G62.9        Past Medical/Surgical History:   Past Medical History:   Diagnosis Date    Chronic headaches     GERD (gastroesophageal reflux disease)     Hemorrhoids     Hypertension     Lumbar disc disease      Past Surgical History:   Procedure Laterality Date    LUMBAR LAMINECTOMY      TONSILLECTOMY           Allergies:    Allergies   Allergen Reactions    Ibuprofen Hives       Current medications:    Current Outpatient Medications   Medication Sig    amLODIPine (NORVASC) 5 mg tablet TAKE 1 TABLET (5 MG TOTAL) BY MOUTH DAILY.    GABAPENTIN 300 MG capsule TAKE 1 CAPSULE BY MOUTH EVERY DAY AT NIGHT    atorvastatin (LIPITOR) 40 mg tablet TAKE 1 TABLET BY MOUTH EVERY DAY    pantoprazole (PROTONIX) 40 mg EC tablet TAKE 1  TABLET (40 MG TOTAL) BY MOUTH DAILY SWALLOW WHOLE. DO NOT CRUSH, BREAK, OR CHEW.    psyllium (METAMUCIL) 95 % packet Take 1 packet by mouth daily    ketoconazole (NIZORAL) 2 % cream Apply topically daily  to the following areas: groin    nystatin (NYSTATIN) powder Apply topically 3 times daily  to the following areas: groin    aspirin-acetaminophen-caffeine (EXCEDRIN MIGRAINE) 250-250-65 mg per tablet Take 1 tablet by mouth every 8 hours as needed for Pain       Family History:    Family History   Problem Relation Age of Onset    Cancer Sister        Social/Occupational History:   Social History     Socioeconomic History    Marital status: Divorced   Tobacco Use    Smoking status: Former     Types: Cigarettes     Quit date: 12/28/2019     Years since quitting: 1.8    Smokeless tobacco: Never   Substance and Sexual Activity    Alcohol use: Yes  Alcohol/week: 7.0 standard drinks     Types: 7 Cans of beer per week    Drug use: Never    Sexual activity: Not Currently     Partners: Female     Birth control/protection: None         Review of Systems:    ROS  Other than HPI all ROS negative.     Vital Signs:   BP 126/64 (BP Location: Right arm, Patient Position: Sitting, Cuff Size: large adult)    Pulse 58    Temp 36.4 C (97.6 F) (Temporal)    Ht 1.803 m (5\' 11" ) Comment: per pt   Wt 127.4 kg (280 lb 12.8 oz)    SpO2 97%    BMI 39.16 kg/m       PHYSICAL EXAM:  Physical Exam  Physical Exam  Constitutional:       Appearance: Normal appearance.   HENT:      Head: Normocephalic and atraumatic.      Right Ear: Tympanic membrane normal.      Left Ear: Tympanic membrane normal.      Mouth/Throat:      Mouth: Mucous membranes are moist.   Eyes:      Extraocular Movements: Extraocular movements intact.      Conjunctiva/sclera: Conjunctivae normal.      Pupils: Pupils are equal, round, and reactive to light.   Cardiovascular:      Rate and Rhythm: Normal rate and regular rhythm.      Heart sounds: Normal heart sounds.  No murmur heard.     Pulmonary:      Effort: No respiratory distress.      Breath sounds: Normal breath sounds.   Abdominal:      General: Abdomen is flat. Bowel sounds are normal.      Palpations: Abdomen is soft.   Musculoskeletal:         General: Normal range of motion.      Cervical back: Normal range of motion.   Skin:     General: Skin is warm and dry.   Neurological:      General: No focal deficit present.      Mental Status: Alert and oriented to person, place, and time.   Psychiatric:         Mood and Affect: Mood normal.         Behavior: Behavior normal.         Thought Content: Thought content normal.        Assessment:    [name] was seen today for follow-up.    Diagnoses and all orders for this visit:    Healthcare maintenance    Positive colorectal cancer screening using Cologuard test  -     AMB REFERRAL TO [facility]    Hypertension, unspecified type  -     Hemoglobin A1c; Future  -     CBC and differential; Future  -     Lipid Panel (Reflex to Direct  LDL if Triglycerides more than 400); Future  -     Comprehensive metabolic panel; Future       .      Plan:      Healthcare Maintenance  Vaccine hx -Last tetanus uncertain; completed shingles vaccine; has had Prevnar 20  COVID-19 vaccine -Moderna x2;plus abooster; has not had bivalent booster    Positive Cologuard  - Referral to GI placed at this time    Hypertension  - Monitoring blood work  as above  - Blood pressure goal  - Continue Norvasc 5 mg daily, Lipitor 40 mg daily    GERD  - Stable on Protonix    Lumbar back pain  - Has appointment with orthopedics in a few weeks      Follow up in about 6 months (around 05/04/2022) for Evaluate treatment plan.     Dorina Hoyer MD  [facility]

## 2021-11-04 NOTE — Telephone Encounter (Signed)
Writer called patient's phone number to inform patient Nicholas Page referral was proccessed.   Patient's phone left an automated message stating that the phone cannot receive calls.  Writer left a message with referral contact information in patient's MyChart.

## 2021-11-06 ENCOUNTER — Other Ambulatory Visit
Admission: RE | Admit: 2021-11-06 | Discharge: 2021-11-06 | Disposition: A | Discharge status: Home / Self Care | Payer: Medicare (Managed Care) | Source: Ambulatory Visit | Attending: Family Medicine | Admitting: Family Medicine

## 2021-11-06 DIAGNOSIS — I10 Essential (primary) hypertension: Secondary | ICD-10-CM | POA: Insufficient documentation

## 2021-11-06 LAB — CBC AND DIFFERENTIAL
Baso # K/uL: 0.1 10*3/uL (ref 0.0–0.1)
Basophil %: 1 %
Eos # K/uL: 0.2 10*3/uL (ref 0.0–0.5)
Eosinophil %: 4.3 %
Hematocrit: 48 % (ref 40–51)
Hemoglobin: 15.8 g/dL (ref 13.7–17.5)
IMM Granulocytes #: 0 10*3/uL (ref 0.0–0.0)
IMM Granulocytes: 0.2 %
Lymph # K/uL: 1.7 10*3/uL (ref 1.3–3.6)
Lymphocyte %: 33.3 %
MCH: 31 pg (ref 26–32)
MCHC: 33 g/dL (ref 32–37)
MCV: 93 fL — ABNORMAL HIGH (ref 79–92)
Mono # K/uL: 0.4 10*3/uL (ref 0.3–0.8)
Monocyte %: 8.4 %
Neut # K/uL: 2.7 10*3/uL (ref 1.8–5.4)
Nucl RBC # K/uL: 0 10*3/uL (ref 0.0–0.0)
Nucl RBC %: 0 /100 WBC (ref 0.0–0.2)
Platelets: 219 10*3/uL (ref 150–330)
RBC: 5.1 MIL/uL (ref 4.6–6.1)
RDW: 12.5 % (ref 11.6–14.4)
Seg Neut %: 52.8 %
WBC: 5.1 10*3/uL (ref 4.2–9.1)

## 2021-11-06 LAB — COMPREHENSIVE METABOLIC PANEL
ALT: 36 U/L (ref 0–50)
AST: 25 U/L (ref 0–50)
Albumin: 4.4 g/dL (ref 3.5–5.2)
Alk Phos: 117 U/L (ref 40–130)
Anion Gap: 12 (ref 7–16)
Bilirubin,Total: 0.4 mg/dL (ref 0.0–1.2)
CO2: 27 mmol/L (ref 20–28)
Calcium: 9.3 mg/dL (ref 8.6–10.2)
Chloride: 102 mmol/L (ref 96–108)
Creatinine: 1.03 mg/dL (ref 0.67–1.17)
Glucose: 114 mg/dL — ABNORMAL HIGH (ref 60–99)
Lab: 17 mg/dL (ref 6–20)
Potassium: 4.2 mmol/L (ref 3.3–5.1)
Sodium: 141 mmol/L (ref 133–145)
Total Protein: 6.6 g/dL (ref 6.3–7.7)
eGFR BY CREAT: 80 *

## 2021-11-06 LAB — LIPID PANEL
Chol/HDL Ratio: 5.1
Cholesterol: 142 mg/dL
HDL: 28 mg/dL — ABNORMAL LOW (ref 40–60)
LDL Calculated: 57 mg/dL
Non HDL Cholesterol: 114 mg/dL
Triglycerides: 286 mg/dL — AB

## 2021-11-06 LAB — HEMOGLOBIN A1C: Hemoglobin A1C: 6 % — ABNORMAL HIGH

## 2021-11-10 NOTE — Result Encounter Note (Signed)
Your blood work results have come back. It looks consistent with previous results.    Your hemoglobin A1C, which is a diabetes screening/tracking number, has come back mildly elevated at 6.0. This is in the prediabetes range and has increased in the past year from 5.6. Normal is 5.5 or lower and diabetes for be 6.5 or higher. This means your number is in the prediabetes range. This does not mean you have diabetes now or will 100% develop it in the future. We do know that people who are in the prediabetes range are at risk for developing diabetes in the future. With improvements to diet and exercise, you can work to improve this number. We will continue to monitor this in the future. Please work on good dietary choices including smaller portion sizes, integrating lots of fruits and vegetables, and avoiding fast food/processed foods.        Dorina Hoyer MD  [facility]

## 2021-11-14 NOTE — Telephone Encounter (Signed)
Scheduled, prep sent via mychart, confirmed pt can access mychart.

## 2021-11-16 NOTE — Progress Notes (Signed)
Name: Nicholas Page    Date Of Birth: [birth year]    eMRN: [id number]    Referring Physician: Jefm Petty, MD  [address]  [address]  [place],  [place] [address]       History of Present Illness:    Nicholas Page is a 66 year old male with a past medical history of HTN, GERD who presents with back and left lower extremity pain. He has a history of prior decompression surgery in 2007 and 2008 (or 2003, 2004, he cannot remember exactly) in [place], [place].    Prior to his initial surgery, he had pain from a "blown disc" which led to pain, numbness radiating into his left lower extremity. This resolved partially after the surgery. 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 He also reports leg muscle atrophy since the surgery. His numbness was only the lateral leg and foot at first, but has since gone on to the entire foot. Five years ago, he began developing right leg, and specifically right foot numbness. Currently, he has bilateral dorsal and plantar foot numbness.     He saw Kelli Churn in November and December, and was started on gabapentin. He states that this has not helped much. He also received an EMG and MRI.     EMG 08/12/21:    1. Abnormal study.  2. Electrodiagnostic evidence of a chronic, primarily axonal, sensorimotor, large fiber peripheral polyneuropathy affecting the lower limbs.   3. 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     Denies any bowel or bladder incontinence.      Past Medical History:   Diagnosis Date   . Chronic headaches    . GERD (gastroesophageal reflux disease)    . Hemorrhoids    . Hypertension    . Lumbar disc disease          Past Surgical History:   Procedure Laterality Date   . LUMBAR LAMINECTOMY     . TONSILLECTOMY           Family History   Problem Relation Age of Onset   . Cancer  Sister          Medications:  Current Outpatient Medications   Medication   . amLODIPine (NORVASC) 5 mg tablet   . GABAPENTIN 300 MG capsule   . atorvastatin (LIPITOR) 40 mg tablet   . pantoprazole (PROTONIX) 40 mg EC tablet   . psyllium (METAMUCIL) 95 % packet   . ketoconazole (NIZORAL) 2 % cream   . nystatin (NYSTATIN) powder   . aspirin-acetaminophen-caffeine (EXCEDRIN MIGRAINE) 250-250-65 mg per tablet     No current facility-administered medications for this visit.         Allergies:  Allergies   Allergen Reactions   . Ibuprofen Hives         Social History     Tobacco Use   . Smoking status: Former     Types: Cigarettes     Quit date: 12/28/2019     Years since quitting: 1.8   . Smokeless tobacco: Never   Substance Use Topics   . Alcohol use: Yes     Alcohol/week: 7.0 standard drinks     Types: 7 Cans of beer per week   . Drug use: Never  Physical Examination:  BP (!) 182/94 Comment: manual  Ht 1.803 m (5\' 11" )   Wt 126.1 kg (278 lb 1.6 oz)   BMI 38.79 kg/m     GEN: well-appearing, NAD    MOTOR RIGHT:   Hip flexion 4+/5  Knee extension 5/5  Knee flexion 5/5  Ankle dorsiflexion 5/5  Hallux extension 5/5  Ankle plantarflexion 5/5    MOTOR LEFT:  Hip flexion 4+/5  Knee extension 5/5  Knee flexion 5/5  Ankle dorsiflexion 5/5  Hallux extension 5/5  Ankle plantarflexion 5/5    SENSATION: is normal to light touch in the lower extremities with the exception of dorsum of both feet bilaterally.      Imaging Studies:    The images were personally reviewed by me in clinic.    MRI Lumbar Spine 08/03/21:     L1-2: no significant foraminal or central stenosis  L2-3: no significant foraminal or central stenosis  L3-4: no significant foraminal or central stenosis  L4-5: no significant central stenosis, mild bilateral foraminal stenosis  L5-S1: evidence of prior left laminotomy defect, large disc protrusion causing compression on the thecal sac centrally with only mild central stenosis    XR Lumbar Spine 11/18/21:      Evidence of facet arthropathy and pronounced degenerative changes at L4-5, L5-S1      Impression/Plan:    Nicholas Page is a 66 year old male who presents with bilateral foot numbness and back pain. 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 However, this does not explain his symptoms, particularly in the setting of possible large fiber peripheral polyneuropathy evidenced by EMG.    I think a surgery has a low probability of improving his sensation deficits and in this instance the risks would outweigh the benefits.    To better evaluate the etiology for his numbness, we will refer him to Neurology.    He may follow up as needed.    Thank you for this consultation.    _______________________    Lorelle Formosa MD      [facility]  [address]  [place], [place] [address]    Office Phone Number: [phone]  Office Fax Number: [phone]

## 2021-11-17 ENCOUNTER — Other Ambulatory Visit: Payer: Self-pay

## 2021-11-17 DIAGNOSIS — Z01812 Encounter for preprocedural laboratory examination: Secondary | ICD-10-CM

## 2021-11-17 NOTE — Progress Notes (Signed)
Covid order placed for scheduled procedure.

## 2021-11-18 ENCOUNTER — Other Ambulatory Visit: Payer: Self-pay

## 2021-11-18 ENCOUNTER — Ambulatory Visit: Payer: Medicare (Managed Care) | Admitting: Orthopedic Surgery

## 2021-11-18 ENCOUNTER — Ambulatory Visit
Admission: RE | Admit: 2021-11-18 | Discharge: 2021-11-18 | Disposition: A | Discharge status: Home / Self Care | Payer: Medicare (Managed Care) | Source: Ambulatory Visit

## 2021-11-18 ENCOUNTER — Encounter: Payer: Self-pay | Admitting: Orthopedic Surgery

## 2021-11-18 VITALS — BP 182/94 | Ht 71.0 in | Wt 278.1 lb

## 2021-11-18 DIAGNOSIS — M4726 Other spondylosis with radiculopathy, lumbar region: Secondary | ICD-10-CM

## 2021-11-18 DIAGNOSIS — M5117 Intervertebral disc disorders with radiculopathy, lumbosacral region: Secondary | ICD-10-CM

## 2021-11-18 DIAGNOSIS — G6289 Other specified polyneuropathies: Secondary | ICD-10-CM

## 2021-11-18 DIAGNOSIS — M4316 Spondylolisthesis, lumbar region: Secondary | ICD-10-CM

## 2021-11-18 DIAGNOSIS — M519 Unspecified thoracic, thoracolumbar and lumbosacral intervertebral disc disorder: Secondary | ICD-10-CM

## 2021-11-20 DIAGNOSIS — G629 Polyneuropathy, unspecified: Secondary | ICD-10-CM

## 2021-11-24 ENCOUNTER — Telehealth: Payer: Self-pay

## 2021-11-24 NOTE — Telephone Encounter (Signed)
Spoke with [name] , patient, and discussed the following  items prior to their Col procedure on 12/08/2021 with  GI FACULTY NAME: Dr. Harlin Rain  arriving at [place] location     Has the patient received their prep instructions? Yes    Has the patient discussed how to proceed with any prescribed blood thinning medications excluding Aspirin with a nurse, if applicable? No Comment: NA     [name] , patient, was notified of COVID Testing protocol for their procedure site, see below:    Patient must  go within 5 days prior to their procedure and negative COVID result must be received by the office. Patient reports they will go to [facility] in his area where he lives. Provided our fax number     Notified JAFETH MUSTIN , patient,  if they develop any COVID symptoms prior to your procedure, they must call the office at [phone], option 1.:     Covid symptoms include:   Body aches   Congestion/runny nose   Cough   Difficulty breathing   Fever   Loss of taste/smell   Severe fatigue   Severe migraine   Shaking chills   Sore throat

## 2021-12-04 ENCOUNTER — Encounter: Payer: Self-pay | Admitting: Family Medicine

## 2021-12-04 NOTE — Telephone Encounter (Signed)
LM for patient CB  Provide phone number for [facility] upon call back   [phone]) [phone]

## 2021-12-05 ENCOUNTER — Telehealth: Payer: Self-pay

## 2021-12-05 NOTE — Telephone Encounter (Signed)
Called pt. He said he located the prep instructions on my chart and is all set. He did get his covid test done and will bring results with him. He appreciated the return call.

## 2021-12-05 NOTE — Telephone Encounter (Signed)
Patient contacted.  Patient will call [place].

## 2021-12-05 NOTE — Telephone Encounter (Signed)
Patient would like to speak with a nurse about their prep instructions.    Patient is concerned he forgot about his prep instructions on MyChart and isn't sure where to start with his prep instructions since his procedure is 12/08/2021 and today is 12/05/2021.

## 2021-12-08 ENCOUNTER — Ambulatory Visit
Admission: RE | Admit: 2021-12-08 | Discharge: 2021-12-08 | Disposition: A | Discharge status: Home / Self Care | Payer: Medicare (Managed Care) | Source: Ambulatory Visit | Attending: Liver Pancreas GI Surgery | Admitting: Liver Pancreas GI Surgery

## 2021-12-08 ENCOUNTER — Other Ambulatory Visit: Payer: Self-pay

## 2021-12-08 ENCOUNTER — Encounter: Payer: Self-pay | Admitting: Liver Pancreas GI Surgery

## 2021-12-08 ENCOUNTER — Encounter
Admission: RE | Disposition: A | Discharge status: Home / Self Care | Payer: Self-pay | Source: Ambulatory Visit | Attending: Liver Pancreas GI Surgery

## 2021-12-08 DIAGNOSIS — Z1211 Encounter for screening for malignant neoplasm of colon: Secondary | ICD-10-CM | POA: Insufficient documentation

## 2021-12-08 DIAGNOSIS — Z1212 Encounter for screening for malignant neoplasm of rectum: Secondary | ICD-10-CM

## 2021-12-08 DIAGNOSIS — K648 Other hemorrhoids: Secondary | ICD-10-CM | POA: Insufficient documentation

## 2021-12-08 DIAGNOSIS — D122 Benign neoplasm of ascending colon: Secondary | ICD-10-CM | POA: Insufficient documentation

## 2021-12-08 DIAGNOSIS — D123 Benign neoplasm of transverse colon: Secondary | ICD-10-CM | POA: Insufficient documentation

## 2021-12-08 DIAGNOSIS — R195 Other fecal abnormalities: Secondary | ICD-10-CM

## 2021-12-08 DIAGNOSIS — K5730 Diverticulosis of large intestine without perforation or abscess without bleeding: Secondary | ICD-10-CM | POA: Insufficient documentation

## 2021-12-08 HISTORY — PX: PR COLONOSCOPY FLX DX W/COLLJ SPEC WHEN PFRMD: 45378

## 2021-12-08 LAB — HM COLONOSCOPY

## 2021-12-08 SURGERY — COLONOSCOPY, FLEXIBLE
Anesthesia: Moderate Sedation

## 2021-12-08 MED ORDER — MIDAZOLAM HCL 1 MG/ML IJ SOLN *I* WRAPPED
INTRAMUSCULAR | Status: AC
Start: 2021-12-08 — End: 2021-12-08
  Filled 2021-12-08: qty 10

## 2021-12-08 MED ORDER — FENTANYL CITRATE 50 MCG/ML IJ SOLN *WRAPPED*
INTRAMUSCULAR | Status: AC
Start: 2021-12-08 — End: 2021-12-08
  Filled 2021-12-08: qty 2

## 2021-12-08 MED ORDER — SODIUM CHLORIDE 0.9 % FLUSH FOR PUMPS *I*
0.0000 mL/h | INTRAVENOUS | Status: DC | PRN
Start: 2021-12-08 — End: 2021-12-08

## 2021-12-08 MED ORDER — DEXTROSE 5 % FLUSH FOR PUMPS *I*
0.0000 mL/h | INTRAVENOUS | Status: DC | PRN
Start: 2021-12-08 — End: 2021-12-08

## 2021-12-08 MED ORDER — FENTANYL CITRATE 50 MCG/ML IJ SOLN *WRAPPED*
INTRAMUSCULAR | Status: AC | PRN
Start: 2021-12-08 — End: 2021-12-08
  Administered 2021-12-08: 25 ug via INTRAVENOUS
  Administered 2021-12-08: 50 ug via INTRAVENOUS
  Administered 2021-12-08: 25 ug via INTRAVENOUS

## 2021-12-08 MED ORDER — SODIUM CHLORIDE 0.9 % IV SOLN WRAPPED *I*
50.0000 mL/h | Status: DC
Start: 2021-12-08 — End: 2021-12-08
  Administered 2021-12-08: 50 mL/h via INTRAVENOUS

## 2021-12-08 MED ORDER — LIDOCAINE HCL (PF) 1 % IJ SOLN *I*
0.1000 mL | INTRAMUSCULAR | Status: DC | PRN
Start: 2021-12-08 — End: 2021-12-08

## 2021-12-08 MED ORDER — MIDAZOLAM HCL 1 MG/ML IJ SOLN *I* WRAPPED
INTRAMUSCULAR | Status: AC | PRN
Start: 2021-12-08 — End: 2021-12-08
  Administered 2021-12-08: 2 mg via INTRAVENOUS
  Administered 2021-12-08 (×2): 1 mg via INTRAVENOUS
  Administered 2021-12-08: 2 mg via INTRAVENOUS

## 2021-12-08 SURGICAL SUPPLY — 2 items
SNARE STANDARD SOFT ACU 2.5 X 5.5 (Supply) ×2 IMPLANT
TRAP POLYP MULTICHAMBER DISP OPTIMIZER (Supply) ×2 IMPLANT

## 2021-12-08 NOTE — Procedures (Addendum)
Procedure Report  Colonoscopy Procedure Note   Date of Procedure: 12/08/2021   Referring Physician: Dorina Hoyer, MD  Primary Physician: Dorina Hoyer, MD        Attending Physician: Harlin Rain, MD  Fellow:   None  Indications:  Colorectal cancer screening-Positive colorectal cancer screening using Cologuard test  Previous colonoscopy: No  Medications: Fentanyl 100 mcg IV and Midazolam 6 mg IV were administered incrementally over the course of the procedure to achieve an adequate level of conscious sedation.    Moderate Sedation Face Times  Start Time: 4742  End Time: 1451  Duration (minutes): 38 Minutes        I provided moderate sedation.  During this time I was face-to-face with the patient and supervising the RN; who monitored the patient's level of consciousness and physiological status.    Scopes: [id number]    Procedure Details: Full disclosures of risks were reviewed with patient as detailed on the consent form. The patient was placed in the left lateral decubitus position and monitored with continuous pulse oximetry, interval blood pressure monitoring and direct observations.   After anorectal examination was performed, the colonoscope was inserted into the rectum and advanced under direct vision to the cecum, which was identified by  the ileocecal valve and the appendiceal orifice. The procedure was considered more difficult than average. Additional maneuvers used: applied abdominal pressure  During withdrawal examination, the final quality of the prep was Boston Bowel Prep Scale:  Right Colon: Grade3- (entire mucosa of colon segment seen well, with no residual staining, small fragments of stool, or opaque liquid)  Transverse Colon: Grade 3- (entire mucosa of colon segment seen well, with no residual staining, small fragments of stool, or opaque liquid)  Left Colon: Grade 3- (entire mucosa of colon segment seen well, with no residual staining, small fragments of stool, or opaque liquid)  A  careful inspection was made as the colonoscope was withdrawn. A retroflexed view of the rectum was performed; findings and interventions are described below. The patient was recovered in the GI recovery area.     Scope Times:     Insertion Time: 1420  Cecum Time: 6433  Exit Time: 2951    Findings:    Anorectal:   Normal tone, no masses  Terminal Ileum:   Not evaluated  Cecum:   Normal mucosa  Ascending Colon:   1 polyp(s) 4 mm in size,  sessile, removed by cold snare and retrieved and sent to pathology   1 polyp(s) 3 mm in size,  sessile, removed by cold snare and retrieved and sent to pathology  Transverse Colon:   1 polyp(s) 3 mm in size,  sessile, removed by cold snare and retrieved and sent to pathology  Descending Colon:   Normal mucosa  Sigmoid:   Diverticulosis: scattered, small in size    Rectum:   Internal hemorrhoids, minimal      Intervention(s):      3 polyp(s) removed by cold snare biopsy      Complication(s):     none    EBL: <1 ml    Impression(s):     Three small polyps removed as above.   Diverticulosis, located in the sigmoid colon.   Otherwise normal colonoscopy to the cecum.    Recommendation(s):     Await pathology.   If adenoma is present, repeat colonoscopy in 3-5 years.   Follow up with primary care physician.    Histopathologic Diagnosis: Adenomas confirmed, repeat exam recommended in 3-5 years.  FINAL DIAGNOSIS:   A. Colon, right (polyps), biopsy:   -Tubular adenoma(s).     B. Colon, transverse (polyp), biopsy:   -Tubular adenoma.     Harlin Rain, MD

## 2021-12-08 NOTE — Preop H&P (Signed)
OUTPATIENT PRE-PROCEDURE H&P    Chief Complaint / Indications for Procedure: Positive colorectal cancer screening using Cologuard test    Past Medical History:     Past Medical History:   Diagnosis Date    Chronic headaches     GERD (gastroesophageal reflux disease)     Hemorrhoids     Hypertension     Lumbar disc disease      Past Surgical History:   Procedure Laterality Date    LUMBAR LAMINECTOMY      TONSILLECTOMY       Family History   Problem Relation Age of Onset    Cancer Sister      Social History     Socioeconomic History    Marital status: Divorced   Tobacco Use    Smoking status: Former     Types: Cigarettes     Quit date: 12/28/2019     Years since quitting: 1.9    Smokeless tobacco: Never   Substance and Sexual Activity    Alcohol use: Yes     Alcohol/week: 7.0 standard drinks of alcohol     Types: 7 Cans of beer per week    Drug use: Never    Sexual activity: Not Currently     Partners: Female     Birth control/protection: None       Allergies:    Allergies   Allergen Reactions    Ibuprofen Hives       Medications:  Medications Prior to Admission   Medication Sig    amLODIPine (NORVASC) 5 mg tablet TAKE 1 TABLET (5 MG TOTAL) BY MOUTH DAILY.    GABAPENTIN 300 MG capsule TAKE 1 CAPSULE BY MOUTH EVERY DAY AT NIGHT    atorvastatin (LIPITOR) 40 mg tablet TAKE 1 TABLET BY MOUTH EVERY DAY    pantoprazole (PROTONIX) 40 mg EC tablet TAKE 1 TABLET (40 MG TOTAL) BY MOUTH DAILY SWALLOW WHOLE. DO NOT CRUSH, BREAK, OR CHEW.    psyllium (METAMUCIL) 95 % packet Take 1 packet by mouth daily    ketoconazole (NIZORAL) 2 % cream Apply topically daily  to the following areas: groin    nystatin (NYSTATIN) powder Apply topically 3 times daily  to the following areas: groin    aspirin-acetaminophen-caffeine (EXCEDRIN MIGRAINE) 250-250-65 mg per tablet Take 1 tablet by mouth every 8 hours as needed for Pain      Current Facility-Administered Medications   Medication Dose Route Frequency    sodium chloride  0.9 % FLUSH REQUIRED IF PATIENT HAS IV  0-500 mL/hr Intravenous PRN    dextrose 5 % FLUSH REQUIRED IF PATIENT HAS IV  0-500 mL/hr Intravenous PRN    sodium chloride 0.9 % IV  50 mL/hr Intravenous Continuous     Vitals:    12/08/21 1330   BP: 149/72   Pulse: 65   Resp: 16   Temp: 36.3 C (97.3 F)   Weight: 124.2 kg (273 lb 14.4 oz)   Height: 180.3 cm ('5\' 11"'$ )       ROS:  PI:RJJOACZY    Physical Examination:  Head/Nose/Throat:negative  Lungs:Lungs clear  Cardiovascular:normal S1 and S2  Abdomen: abdomen soft, non-tender, nondistended, normal active bowel sounds, no masses or organomegaly      Lab Results: none    Radiology impressions (last 30 days):  Spine lumbar  AP, Lateral, Oblique views    Result Date: 11/18/2021  11/18/2021 1:00 PM LUMBAR SPINE X-RAYS CLINICAL INFORMATION:  back and/or leg pain, M51.9-Unspecified thoracic, thoracolumbar and lumbosacral  intervertebral disc disorder. COMPARISON:  05/26/2021 PROCEDURE: Four projections of the lumbar spine were obtained. IMPRESSION/FINDINGS: The vertebral body heights are maintained. Multi-level degenerative disc space changes are noted, most pronounced at the lumbosacral junction. Degenerative facet changes are seen at the lower lumbar spine.  There is minimal retrolisthesis of L4 on L5. No abnormal segmental motion is identified. There is no significant scoliosis. The sacroiliac joints are grossly symmetric. END OF IMPRESSION [facility] submits this DICOM format image data and final report to the [facility], an independent secure electronic health information exchange, on a reciprocally searchable basis (with patient authorization) for a minimum of 12 months after exam date.      Currently Active Problems:  Patient Active Problem List   Diagnosis Code    Lumbar disc disease M51.9    Constipation K59.00    Chest pain R07.9    Hypertension I10    GERD (gastroesophageal reflux disease) K21.9    Hemorrhoids K64.9    Peripheral polyneuropathy G62.9         Impression:  Screening colonoscopy    Plan:  Colonoscopy  Moderate Sedation    UPDATES TO PATIENT'S CONDITION on the DAY OF SURGERY/PROCEDURE    I. Updates to Patient's Condition (to be completed by a provider privileged to complete a H&P, following reassessment of the patient by the provider):    Full H&P done today; no updates needed.    II. Procedure Readiness   I have reviewed the patient's H&P and updated condition. By completing and signing this form, I attest that this patient is ready for surgery/procedure.      III. Attestation   I have reviewed the updated information regarding the patient's condition and it is appropriate to proceed with the planned surgery/procedure.    Harlin Rain, MD as of 2:01 PM 12/08/2021

## 2021-12-08 NOTE — Discharge Instructions (Addendum)
Gastroenterology Unit  Discharge Instructions for Colonoscopy      12/08/2021    2:02 PM    Colonoscopy and Polyp(s) Removed    Do not drive, operate heavy machinery, drink alcoholic beverages, make important personal or business decisions, or sign legal documents until the next day.     Return to your usual diet  Return to taking your usual medications    Things you may expect:  A small amount of bright red blood in your stool  It may be a few days before you have a bowel movement  You may have cramping, bloating, and feelings of "gas". These feelings should go away as you pass gas. If you still feel uncomfortable, walking around will help to pass the gas.  You were given medication to help you relax during the test. You may feel "fuzzy" and drowsy. Go home and rest for at least 4-6 hours.    You should call your doctor for any of the following:  Bad stomach pain  Fever  Bright red bleeding or clots (This may happen up to 20 days after the test.)  Dizziness or weakness that gets worse or lasts up to 24 hours.  Pain or redness at the IV site    If you have a serious problem after hours, Call [phone] to reach the GI physician on call. If you are unable to reach your doctor, go to the [facility].    Follow Up Care:  If biopsies were taken during your procedure, we will send you the pathology results within 7-10 days. If you do not receive your pathology results after 10 days please call [phone]  Report will be sent to your primary doctor.  Repeat exam in  3-5 year(s)    Current Discharge Medication List

## 2021-12-09 ENCOUNTER — Encounter: Payer: Self-pay | Admitting: Liver Pancreas GI Surgery

## 2021-12-10 LAB — SURGICAL PATHOLOGY

## 2022-01-29 ENCOUNTER — Other Ambulatory Visit: Payer: Self-pay | Admitting: Family Medicine

## 2022-01-29 DIAGNOSIS — K219 Gastro-esophageal reflux disease without esophagitis: Secondary | ICD-10-CM

## 2022-02-18 NOTE — Progress Notes (Addendum)
Neuromuscular New Patient Visit Note    Nicholas Page is referred for neuromuscular evaluation for his complaints of back pain and numbness.    HPI: He is a 66 year old man with chronic low back pain that radiates down his left leg to the foot with numbness. He also has bilateral foot numbness to the ankle level. He had previous spine surgery.    Nicholas Page reports that 35 years ago he blew out his disc in his back and had radiating left leg pains and numbness over outside of the left foot. He later had 2 lumbar spine surgeries. He never had any issues with the right side until he was 66 years old and woke up one day with numbness of the right foot. Since then, the numbness has stayed about the same. He has numbness over the entire foot to just below the ankle. He also has had worsening of the left foot numbness so that now he has numbness of the entire left foot as well. The numbness is constant, not painful but can be uncomfortable.  5 years ago he first started noting numbness in his right foot but this has progressed to the level of the ankle.    He has chronic weakness in the left leg, primarily notices that he can't rise up onto his toes on the left foot. He denies any weakness in the right leg. No falls. He gets only occasionally low back pain now. No shooting pains down the legs.  No symptoms in his arms or hands.     He takes gabapentin 300 mg nightly sometimes, though doesn't think this has clearly helped.       Evaluations:    He had an EMG by Dr. Katrinka Page of [facility] which reported evidence for chronic axonal peripheral neuropathy. On the EMG he reported to have absent sural sensory responses on both sides, low amplitude bilateral tibial motor responses (1.4 mV left and 1.7 mV right) and and a normal right fibular motor response, and he had an absent left fibular motor response. Needle EMG of the bilateral calf muscles described to show chronic neurogenic changes with mild uncompensated denervation in the left  gastrocnemius muscle. Waveforms revealed reduced recruitment and large motor units in the left gastrocnemius muscle but essentially normal to minimally large motor units in the right tibialis anterior muscle.    MRI of the lumbar spine in November 2022 images reviewed. On my review, he has a large posterior disc extrusion with inferior migration left causing compression of the left S1 root. Also bilateral facet hypertrophy L5-S1, greater on the left with moderate foramina narrowing on the left, bilateral facet hypertrophy with moderate to severe bilateral foraminal narrowing at L4-5    Labs: Hgb A1C 6.0, triglycerides 286 (previously 561 a year ago) in Feb 2023.     Medical history: Chronic back pain with lumbar spondylosis, GERD, headaches, hyperlipidemia, hypertension, heart murmur.     Surgical history: Lumbar laminectomy. Tonsillectomy.    Medications: Amlodipine 5 mg daily, atorvastatin 40 mg daily, docusate daily, Excedrin Migraine as needed, pantoprazole 40 mg daily.    Allergies: Ibuprofen.    Family history: Both parents are deceased. Sister has cancer. No one in family with neuropathy.     Social: Former smoker who quit in 2021. 5 cans of beer in a week. No drug use. Lives with sister and brother in law. 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     Physical Examination:     Vitals:  02/19/22 0833   BP: 141/77   Pulse: 69   Temp: 36.3 C (97.3 F)         General: Pleasant and sitting comfortably. NAD. Central obesity pattern.  CV:  RRR  Pulm: CTA    MS: Alert and interactive. Language appears intact.  CN: Pupils equally round and reactive to light. Visual fields intact. Ocular versions full and conjugate. Pursuits are smooth. Facial sensation intact to light touch.  Able to raise eye brows symmetrically. Able to bury lashes on eye closure with symmetrical resistance to opening. Able to puff up cheeks. Smile symmetrical with equal activation. Hearing grossly intact. Palate elevates  symmetrically. Shoulder shrug strong bilaterally. No atrophy of the tongue. No dysarthria.    Motor:  Tone : normal  Muscle bulk: severe left calf atrophy, greatest posterior calf  Pronator drift: none  Involuntary movements: none  Muscle Right Left   Neck flexor 5    Upper extremity     Shoulder abductor 5 5   Shoulder forward flexor 5 5   Shoulder external rotator 5 5   Shoulder internal rotator 5 5   Elbow flexors 5 5   Elbow extensors 5 5   Wrist flexors 5 5   Wrist extensors 5 5   Distal finger flexors  5 5   Finger extensors 5 5   Finger abductors 5 5   Thumb flexor (FPL) 5 5   Thumb abductor 5 5   Lower extremity     Hip flexor 5 5   Hip abductor 5 5   Knee flexor 5 4-   Knee extensor 5 5   Ankle dorsiflexor 5 5   Ankle plantarflexor 5 3   Ankle inversion 5 4   Ankle eversion 5 5   Toe extensor 5 4   Toe flexor 5 weak     He can rise onto toes on right, not on left. Can walk on heels.      Reflex Right Left   BR 2+ 2+   Biceps 2+ 2+   Triceps 2+ 2+   Patellae 2+ 2+   Ankles 2+ 0     B/L Toes: going downwards      Sensory:  Pin Prick:Reports decreased sensation to pinprick over right lateral foot to ankle, left foot to just above ankle   Light touch: Intact and symmetric in upper and lower extremities.  Vibration (R/L, in seconds)   Great toe: 0/0   Medial malleolus: 5/5   Tibia: 5/5  Position Sense: Intact at the great toes bilaterally  Romberg's sign: Negative    Coordination:  Finger-Nose-Finger: Intact  Fine finger movements: intact    Gait: Stable on standing and turns. Gait is narrow based with normal stride length and arm swing. Able to perform tandem gait    Impression:     1) Idiopathic, predominantly sensory polyneuropathy.  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 male who presents for evaluation of foot numbness and left leg weakness. He has had longstanding history of left leg/foot weakness and numbness from an old S1  radiculopathy, and over the last 5 years has now developed bilateral foot numbness. His neurological exam today was notable for diminished pinprick and vibratory sensation in the feet, left leg weakness in the S1 distribution, left calf atrophy, absent left ankle jerk. His prior outside EMG/NCS reported evidence for a chronic axonal peripheral neuropathy. Overall,  Mr. Krishnamoorthy has a mild sensory neuropathy, which we suspect is most likely due to his metabolic syndrome (pre-diabetic, elevated triglycerides on most recent lab work). This is superimposed on a chronic severe left sided S1 radiculopathy. At this time we recommend some additional screening for acquired causes of neuropathy, as outlined below.     He should otherwise continue to work on modifying his neuropathy risk factors. Weight loss strategies and exercise may benefit his sensory neuropathy.    We did not repeat EMG testing because he has no symptoms in his upper extremity.    Recommendations:   - Will complete screening labwork for acquired causes of neuropathy: Vitamin B12, SPEP   - Continue to work on modifying risk factors for neuropathy - regular exercise, sugar control  - Follow up as needed     Thank you for allowing [place] to participate in the care of your patient.  Please call us with any questions.    Signed:  Emilio Aspen, MD  Neuromuscular Fellow   [facility]     I saw and evaluated the patient. I have reviewed and edited the resident's/fellow's note and confirm the findings and plan of care as documented.    Cammie Sickle, MD

## 2022-02-19 ENCOUNTER — Ambulatory Visit: Payer: Medicare (Managed Care) | Admitting: Neurology

## 2022-02-19 ENCOUNTER — Ambulatory Visit: Payer: Medicare (Managed Care)

## 2022-02-19 ENCOUNTER — Other Ambulatory Visit: Payer: Self-pay

## 2022-02-19 ENCOUNTER — Encounter: Payer: Self-pay | Admitting: Neurology

## 2022-02-19 VITALS — BP 141/77 | HR 69 | Temp 97.3°F

## 2022-02-19 DIAGNOSIS — M5417 Radiculopathy, lumbosacral region: Secondary | ICD-10-CM

## 2022-02-19 DIAGNOSIS — G629 Polyneuropathy, unspecified: Secondary | ICD-10-CM

## 2022-02-19 DIAGNOSIS — G609 Hereditary and idiopathic neuropathy, unspecified: Secondary | ICD-10-CM

## 2022-02-20 ENCOUNTER — Other Ambulatory Visit
Admission: RE | Admit: 2022-02-20 | Discharge: 2022-02-20 | Disposition: A | Discharge status: Home / Self Care | Payer: Medicare (Managed Care) | Source: Ambulatory Visit | Attending: Neurology | Admitting: Neurology

## 2022-02-20 DIAGNOSIS — G609 Hereditary and idiopathic neuropathy, unspecified: Secondary | ICD-10-CM | POA: Insufficient documentation

## 2022-02-20 LAB — VITAMIN B12: Vitamin B12: 765 pg/mL (ref 232–1245)

## 2022-02-25 ENCOUNTER — Encounter: Payer: Self-pay | Admitting: Student in an Organized Health Care Education/Training Program

## 2022-02-25 LAB — PROTEIN ELECTROPHORESIS, SERUM
A/G Ratio: 1.6 (ref 0.9–1.8)
Albumin: 3.9 g/dL (ref 3.5–5.1)
Alpha 1: 0.3 g/dL (ref 0.2–0.4)
Alpha 2: 0.6 g/dL (ref 0.4–0.9)
Beta: 0.7 g/dL (ref 0.5–1.0)
Gamma: 0.9 g/dL (ref 0.7–1.4)
Interp,PE: NORMAL
Total Protein: 6.4 g/dL (ref 6.3–7.7)

## 2022-02-25 LAB — PE ELECT,REVIEW

## 2022-04-02 ENCOUNTER — Other Ambulatory Visit: Payer: Self-pay | Admitting: Family Medicine

## 2022-04-02 DIAGNOSIS — I10 Essential (primary) hypertension: Secondary | ICD-10-CM

## 2022-05-04 NOTE — Progress Notes (Signed)
[facility]  [facility]     Progress Note    HISTORY:  Chief Complaint   Patient presents with   . Follow-up   . Initial Annual Medicare visit       History of Present Illness:    HPI  Nicholas Page is a 66 y.o. male who presents to clinic for follow-up.    Lumbar back pain - Was scheduled to see Ortho but is rescheduled for a few weeks.    Positive Cologuard - Colonoscopy on 12/08/21 with 3 small polyps and diverticulosis. Repeat in 3-5 years.    HTN - BP today is128/74. Current regimen includesNorvasc 5 mg daily, Lipitor 40 mg daily.Deniespounding headache, chest pain, dizziness on standing.     GERD -Currentlywell-managed with Protonix 40 mg daily.    Denies fevers, chills, chest pain, SOB, nausea, vomiting, changes in bowel or bladder habits.    Problems:  Patient Active Problem List   Diagnosis Code   . Lumbar disc disease M51.9   . Constipation K59.00   . Chest pain R07.9   . Hypertension I10   . GERD (gastroesophageal reflux disease) K21.9   . Hemorrhoids K64.9   . Peripheral polyneuropathy G62.9        Past Medical/Surgical History:   Past Medical History:   Diagnosis Date   . Chronic headaches    . GERD (gastroesophageal reflux disease)    . Hemorrhoids    . Hypertension    . Lumbar disc disease      Past Surgical History:   Procedure Laterality Date   . LUMBAR LAMINECTOMY     . PR COLONOSCOPY FLX DX W/COLLJ SPEC WHEN PFRMD N/A 12/08/2021    Procedure: COLONOSCOPY, FLEXIBLE;  Surgeon: Quitman Livings, MD;  Location: [facility];  Service: GI   . TONSILLECTOMY           Allergies:    Allergies   Allergen Reactions   . Ibuprofen Hives       Current medications:    Current Outpatient Medications   Medication Sig   . amLODIPine (NORVASC) 5 mg tablet TAKE 1 TABLET (5 MG TOTAL) BY MOUTH DAILY.   . pantoprazole (PROTONIX) 40 mg EC tablet TAKE 1 TABLET (40 MG TOTAL) BY MOUTH DAILY SWALLOW WHOLE. DO NOT CRUSH, BREAK, OR CHEW   . atorvastatin (LIPITOR) 40 mg tablet TAKE 1 TABLET BY MOUTH  EVERY DAY   . psyllium (METAMUCIL) 95 % packet Take 1 packet by mouth daily (Patient not taking: Reported on 02/19/2022)   . aspirin-acetaminophen-caffeine (EXCEDRIN MIGRAINE) 250-250-65 mg per tablet Take 1 tablet by mouth every 8 hours as needed for Pain       Family History:    Family History   Problem Relation Age of Onset   . Cancer Sister        Social/Occupational History:   Social History     Socioeconomic History   . Marital status: Divorced   Tobacco Use   . Smoking status: Former     Types: Cigarettes     Quit date: 12/28/2019     Years since quitting: 2.3   . Smokeless tobacco: Never   Vaping Use   . Vaping Use: Never used   Substance and Sexual Activity   . Alcohol use: Not Currently     Alcohol/week: 7.0 standard drinks of alcohol     Types: 7 Cans of beer per week   . Drug use: Never   . Sexual activity:  Not Currently     Partners: Female     Birth control/protection: None         Review of Systems:    ROS  Other than HPI all ROS negative.     Vital Signs:   BP 128/74 (BP Location: Left arm, Patient Position: Sitting, Cuff Size: large adult)   Pulse 69   Temp 36.6 C (97.8 F) (Temporal)   Ht 1.803 m (5\' 11" )   Wt 126.1 kg (278 lb)   SpO2 96% Comment: RA  BMI 38.77 kg/m       PHYSICAL EXAM:  Physical Exam  Physical Exam  Constitutional:       Appearance: Normal appearance.   HENT:      Head: Normocephalic and atraumatic.   Eyes:      Extraocular Movements: Extraocular movements intact.      Conjunctiva/sclera: Conjunctivae normal.      Pupils: Pupils are equal, round, and reactive to light.   Cardiovascular:      Comments: Well perfused  Pulmonary:      Effort: Pulmonary effort is normal. No respiratory distress.   Musculoskeletal:      Cervical back: Normal range of motion.   Skin:     Findings: No lesion.   Neurological:      General: No focal deficit present.      Mental Status: Alert and oriented to person, place, and time.   Psychiatric:         Mood and Affect: Mood normal.         Thought  Content: Thought content normal.        Assessment:    Nicholas Page was seen today for follow-up and initial annual medicare visit.    Diagnoses and all orders for this visit:    Hypertension, unspecified type  -     Hemoglobin A1c; Future  -     Comprehensive metabolic panel; Future  -     CBC and differential; Future  -     TSH; Future  -     T4, free; Future    Peripheral polyneuropathy  -     Hemoglobin A1c; Future  -     Comprehensive metabolic panel; Future  -     CBC and differential; Future  -     TSH; Future  -     T4, free; Future    Hyperlipidemia, mixed  -     Hemoglobin A1c; Future  -     Comprehensive metabolic panel; Future  -     CBC and differential; Future  -     TSH; Future  -     T4, free; Future    Prediabetes  -     Hemoglobin A1c; Future  -     Comprehensive metabolic panel; Future  -     CBC and differential; Future  -     TSH; Future  -     T4, free; Future    Preventative health care    Encounter for hepatitis C screening test for low risk patient  -     HIV 1&2 antigen/antibody; Future    Encounter for screening for HIV  -     Hepatitis C antibody; Future    Screening for AAA (abdominal aortic aneurysm)  -     CV AAA Screening; Future       .      Plan:      Hypertension; hyperlipidemia; prediabetes  - Blood  pressure goal  - Repeat blood work as above  -Continue Norvasc 5 mg daily, Lipitor 40 mg daily    GERD  - At goal  - Continue Protonix 40 mg daily        Follow up in about 1 year (around 05/06/2023).     Nicholas Petty MD  [facility]           Visit performed as:         Office Visit, met with patient in person    Today we reviewed and updated Nicholas Page's smoking status, activities of daily living, depression screen, fall risk, medications and allergies.   I have counseled the patient in the above areas.     Subjective:     Chief Complaint: Nicholas Page is a 66 y.o. male here for a/an Follow-up and Initial Annual Medicare visit    In general, Nicholas Page rates their  overall health as:  fair      Patient Care Team:  Nicholas Petty, MD as PCP - General (Primary Care)     Current Outpatient Medications on File Prior to Visit   Medication Sig Dispense Refill   . amLODIPine (NORVASC) 5 mg tablet TAKE 1 TABLET (5 MG TOTAL) BY MOUTH DAILY. 90 tablet 0   . pantoprazole (PROTONIX) 40 mg EC tablet TAKE 1 TABLET (40 MG TOTAL) BY MOUTH DAILY SWALLOW WHOLE. DO NOT CRUSH, BREAK, OR CHEW 90 tablet 1   . atorvastatin (LIPITOR) 40 mg tablet TAKE 1 TABLET BY MOUTH EVERY DAY 90 tablet 3   . psyllium (METAMUCIL) 95 % packet Take 1 packet by mouth daily (Patient not taking: Reported on 02/19/2022)     . aspirin-acetaminophen-caffeine (EXCEDRIN MIGRAINE) 250-250-65 mg per tablet Take 1 tablet by mouth every 8 hours as needed for Pain       No current facility-administered medications on file prior to visit.     Allergies   Allergen Reactions   . Ibuprofen Hives     Patient Active Problem List    Diagnosis Date Noted   . Peripheral polyneuropathy 08/14/2021   . Hypertension    . GERD (gastroesophageal reflux disease)    . Hemorrhoids    . Lumbar disc disease 08/13/2020   . Constipation 08/13/2020   . Chest pain 08/13/2020     Past Medical History:   Diagnosis Date   . Chronic headaches    . GERD (gastroesophageal reflux disease)    . Hemorrhoids    . Hypertension    . Lumbar disc disease      Past Surgical History:   Procedure Laterality Date   . LUMBAR LAMINECTOMY     . PR COLONOSCOPY FLX DX W/COLLJ SPEC WHEN PFRMD N/A 12/08/2021    Procedure: COLONOSCOPY, FLEXIBLE;  Surgeon: Quitman Livings, MD;  Location: [facility];  Service: GI   . TONSILLECTOMY       Family History   Problem Relation Age of Onset   . Cancer Sister      Social History     Socioeconomic History   . Marital status: Divorced   Tobacco Use   . Smoking status: Former     Types: Cigarettes     Quit date: 12/28/2019     Years since quitting: 2.3   . Smokeless tobacco: Never   Vaping Use   . Vaping Use: Never used   Substance and  Sexual Activity   . Alcohol use: Not Currently  Alcohol/week: 7.0 standard drinks of alcohol     Types: 7 Cans of beer per week   . Drug use: Never   . Sexual activity: Not Currently     Partners: Female     Birth control/protection: None       Objective:     Vital Signs: BP 128/74 (BP Location: Left arm, Patient Position: Sitting, Cuff Size: large adult)   Pulse 69   Temp 36.6 C (97.8 F) (Temporal)   Ht 1.803 m (5\' 11" )   Wt 126.1 kg (278 lb)   SpO2 96% Comment: RA  BMI 38.77 kg/m    BMI: Body mass index is 38.77 kg/m.    Vision Screening Results (Welcome visit only):  No results found.    Depression Screening Results:  Review Flowsheet        05/05/2022 11/04/2021 05/01/2021 10/31/2020 09/17/2020   PHQ Scores   PHQ Calculated Score 0 0 0 0 0     Opioid Use/DAST- 10 Screening Results:   How many times in the past year have you used an illegal drug or used a prescription medication for nonmedical reasons?: 0 (05/01/2022  8:33 PM)    Activities of Daily Living/Functional Screening Results:  Is the person deaf or does he/she have serious difficulty hearing?: N  *Hearing Status: No impairment  Is this person blind or does he/she have serious difficulty seeing even when wearing glasses?: Y  *Vision Status: Visual aid ; Other (Comment) (stigmatism in both eyes, L eye farsided, R eye nearsided)  Does this person have serious difficulty walking or climbing stairs?: Y (chest pain/SOB with stairs)  *Walks in Home: Independent  *Climbing Stairs: Independent  Does this person have difficulty dressing or bathing?: N  *Shopping: Independent  *House Keeping: Independent  *Managing Own Medications: Independent  *Handling Finances: Independent  If you need help, who helps you?: Nicholas Page, sister  Difficulty doing errands due to a physicial, mental or emotional condition: No  Difficulty remembering or making decisions due to a physicial, mental or emotional condition: No      Fall Risk Screening Results:  Have you fallen in the  last year?: No  Do you feel you are at risk for falling?: No      Assessment and Plan:     Cognitive Function:  Recall of recent and remote events appears:  Normal      Advanced Care Planning:  was discussed and the paperwork can be found in the scanned media section     The following health maintenance plan was reviewed with the patient:    Health Maintenance Topics with due status: Overdue       Topic Date Due    IMM DTaP/Tdap/Td Never done    COVID-19 Vaccine 11/18/2020     Health Maintenance Topics with due status: Not Due       Topic Last Completion Date    IMM-INFLUENZA 10/31/2020    Colon Cancer Screening USPSTF 12/08/2021    Depression Screen Yearly 05/05/2022    Fall Risk Screening 05/05/2022     Health Maintenance Topics with due status: Completed       Topic Last Completion Date    IMM Pneumo: 65+ Years 05/01/2021    IMM-ZOSTER 06/18/2021    HIV Screening USPSTF/[place] 05/05/2022    Hepatitis C Screening USPSTF/[place] 05/05/2022    AAA Screening USPSTF 05/12/2022     This health maintenance schedule, identified risks, a list of orders placed today and patient goals have been  provided to Nicholas Page in the after visit summary.     Plan for any concerns identified during screening or risk assessments:  - Low risk HIV and hep C screening ordered  - AAA screening ordered  - Up-to-date on colonoscopies after having positive Cologuard with subsequent colonoscopy on 12/08/2021; repeat in 3 to 5 years    Nicholas Petty MD  [facility] [address]

## 2022-05-05 ENCOUNTER — Ambulatory Visit: Payer: Medicare (Managed Care) | Admitting: Family Medicine

## 2022-05-05 ENCOUNTER — Other Ambulatory Visit
Admission: RE | Admit: 2022-05-05 | Discharge: 2022-05-05 | Disposition: A | Discharge status: Home / Self Care | Payer: Medicare (Managed Care) | Source: Ambulatory Visit | Attending: Family Medicine | Admitting: Family Medicine

## 2022-05-05 ENCOUNTER — Encounter: Payer: Self-pay | Admitting: Family Medicine

## 2022-05-05 ENCOUNTER — Other Ambulatory Visit: Payer: Self-pay

## 2022-05-05 VITALS — BP 128/74 | HR 69 | Temp 97.8°F | Ht 71.0 in | Wt 278.0 lb

## 2022-05-05 DIAGNOSIS — E782 Mixed hyperlipidemia: Secondary | ICD-10-CM | POA: Insufficient documentation

## 2022-05-05 DIAGNOSIS — R7303 Prediabetes: Secondary | ICD-10-CM | POA: Insufficient documentation

## 2022-05-05 DIAGNOSIS — Z136 Encounter for screening for cardiovascular disorders: Secondary | ICD-10-CM

## 2022-05-05 DIAGNOSIS — G629 Polyneuropathy, unspecified: Secondary | ICD-10-CM

## 2022-05-05 DIAGNOSIS — Z0000 Encounter for general adult medical examination without abnormal findings: Secondary | ICD-10-CM

## 2022-05-05 DIAGNOSIS — Z1159 Encounter for screening for other viral diseases: Secondary | ICD-10-CM

## 2022-05-05 DIAGNOSIS — Z114 Encounter for screening for human immunodeficiency virus [HIV]: Secondary | ICD-10-CM | POA: Insufficient documentation

## 2022-05-05 DIAGNOSIS — I10 Essential (primary) hypertension: Secondary | ICD-10-CM | POA: Insufficient documentation

## 2022-05-05 LAB — CBC AND DIFFERENTIAL
Baso # K/uL: 0.1 10*3/uL (ref 0.0–0.1)
Basophil %: 1 %
Eos # K/uL: 0.4 10*3/uL (ref 0.0–0.5)
Eosinophil %: 5.7 %
Hematocrit: 47 % (ref 40–51)
Hemoglobin: 16 g/dL (ref 13.7–17.5)
IMM Granulocytes #: 0 10*3/uL (ref 0.0–0.0)
IMM Granulocytes: 0.3 %
Lymph # K/uL: 2.3 10*3/uL (ref 1.3–3.6)
Lymphocyte %: 32.8 %
MCH: 31 pg (ref 26–32)
MCHC: 34 g/dL (ref 32–37)
MCV: 91 fL (ref 79–92)
Mono # K/uL: 0.7 10*3/uL (ref 0.3–0.8)
Monocyte %: 9.6 %
Neut # K/uL: 3.5 10*3/uL (ref 1.8–5.4)
Nucl RBC # K/uL: 0 10*3/uL (ref 0.0–0.0)
Nucl RBC %: 0 /100 WBC (ref 0.0–0.2)
Platelets: 237 10*3/uL (ref 150–330)
RBC: 5.1 MIL/uL (ref 4.6–6.1)
RDW: 12.8 % (ref 11.6–14.4)
Seg Neut %: 50.6 %
WBC: 7 10*3/uL (ref 4.2–9.1)

## 2022-05-05 LAB — COMPREHENSIVE METABOLIC PANEL
ALT: 44 U/L (ref 0–50)
AST: 35 U/L (ref 0–50)
Albumin: 4.4 g/dL (ref 3.5–5.2)
Alk Phos: 119 U/L (ref 40–130)
Anion Gap: 11 (ref 7–16)
Bilirubin,Total: 0.4 mg/dL (ref 0.0–1.2)
CO2: 25 mmol/L (ref 20–28)
Calcium: 9.5 mg/dL (ref 8.6–10.2)
Chloride: 103 mmol/L (ref 96–108)
Creatinine: 1.13 mg/dL (ref 0.67–1.17)
Glucose: 109 mg/dL — ABNORMAL HIGH (ref 60–99)
Lab: 16 mg/dL (ref 6–20)
Potassium: 5 mmol/L (ref 3.3–5.1)
Sodium: 139 mmol/L (ref 133–145)
Total Protein: 6.5 g/dL (ref 6.3–7.7)
eGFR BY CREAT: 71 *

## 2022-05-05 LAB — TSH: TSH: 3.35 u[IU]/mL (ref 0.27–4.20)

## 2022-05-05 LAB — T4, FREE: Free T4: 1.1 ng/dL (ref 0.9–1.7)

## 2022-05-05 NOTE — Patient Instructions (Signed)
Thank you for completing your Follow-up and Initial Annual Medicare visit   with [place] today.     The purpose of this visits was to:     Screen for disease   Assess risk of future medical problems   Help develop a healthy lifestyle   Update vaccines   Get to know your doctor in case of an illness    Patient Care Team:  Jefm Petty, MD as PCP - General (Primary Care)     Medicare 5 Year Plan    The following items were identified as areas of concern during your screening today:  High Blood Pressure (Hypertension) - This is a risk factor for Heart Attack, Stroke, Kidney Problems and Eye Problems.   BMI greater than 25 - This is a risk for Heart Attack, Stroke, High Blood Pressure, Diabetes, High Cholesterol and other complications.       The Health Maintenance table below identifies screening tests and immunizations recommended by your health care team:  Health Maintenance: These screening recommendations are based on USPSTF, [employer], and [place] state guidelines   Topic Date Due   . HIV Screening  Never done   . Hepatitis C Screening  Never done   . DTaP/Tdap/Td Vaccines (1 - Tdap) Never done   . Abdominal Aortic Aneurysm Screening  Never done   . COVID-19 Vaccine (4 - Moderna series) 11/18/2020   . Flu Shot (1) 05/29/2022   . DEPRESSION SCREEN YEARLY  05/06/2023   . Fall Risk Screening  05/06/2023   . Colon Cancer Screening  12/09/2031   . Shingles Vaccine  Completed   . Pneumococcal Vaccination  Completed     In addition, goals and orders placed to address these recommendations are listed in the "Today's Visit" section.    We wish you the best of health and look forward to seeing you again next year for your Annual Medicare Wellness Visit.     If you have any health care concerns before then, please do not hesitate to contact us.

## 2022-05-06 LAB — HEPATITIS C ANTIBODY: Hep C Ab: NEGATIVE

## 2022-05-06 LAB — HIV-1/2  ANTIGEN/ANTIBODY SCREEN WITH CONFIRMATION: HIV 1&2 ANTIGEN/ANTIBODY: NONREACTIVE

## 2022-05-06 LAB — HEMOGLOBIN A1C: Hemoglobin A1C: 6.2 % — ABNORMAL HIGH

## 2022-05-08 NOTE — Result Encounter Note (Signed)
Your blood work results have come back. It looks consistent with previous results. No changes at this time.    Hendel Gatliff MD  [facility]

## 2022-05-12 ENCOUNTER — Other Ambulatory Visit: Payer: Self-pay

## 2022-05-12 ENCOUNTER — Ambulatory Visit
Admission: RE | Admit: 2022-05-12 | Discharge: 2022-05-12 | Disposition: A | Discharge status: Home / Self Care | Payer: Medicare (Managed Care) | Source: Ambulatory Visit | Attending: Vascular Surgery | Admitting: Vascular Surgery

## 2022-05-12 DIAGNOSIS — Z87891 Personal history of nicotine dependence: Secondary | ICD-10-CM | POA: Insufficient documentation

## 2022-05-12 DIAGNOSIS — Z136 Encounter for screening for cardiovascular disorders: Secondary | ICD-10-CM | POA: Insufficient documentation

## 2022-05-12 LAB — CV AAA SCREENING
Aorta Diameter A-P Distal: 2.36 cm
Aorta Diameter A-P Mid: 2.1 cm
Aorta Diameter A-P Prox: 2.58 cm
Aorta Diameter Trans Distal: 2.25 cm
Aorta Diameter Trans Mid: 2 cm
Aorta Diameter Trans Prox: 2.48 cm
Aorta EDV Mid: 11.87 cm/s
Aorta PSV Mid: 77.63 cm/s

## 2022-05-13 NOTE — Result Encounter Note (Signed)
The ultrasound of the aorta artery (large blood vessel in your belly) has come back looking very good! It is well within the normal sizing. No need to repeat this test moving forward.    Jefm Petty MD  [facility]

## 2022-06-29 ENCOUNTER — Other Ambulatory Visit: Payer: Self-pay | Admitting: Primary Care

## 2022-06-29 DIAGNOSIS — I10 Essential (primary) hypertension: Secondary | ICD-10-CM

## 2022-07-18 ENCOUNTER — Encounter: Payer: Self-pay | Admitting: Family Medicine

## 2022-07-25 ENCOUNTER — Other Ambulatory Visit: Payer: Self-pay | Admitting: Family Medicine

## 2022-07-25 DIAGNOSIS — K219 Gastro-esophageal reflux disease without esophagitis: Secondary | ICD-10-CM

## 2022-08-06 ENCOUNTER — Other Ambulatory Visit: Payer: Self-pay | Admitting: Family Medicine

## 2022-08-06 DIAGNOSIS — E782 Mixed hyperlipidemia: Secondary | ICD-10-CM

## 2022-08-06 DIAGNOSIS — K219 Gastro-esophageal reflux disease without esophagitis: Secondary | ICD-10-CM

## 2022-08-06 DIAGNOSIS — I10 Essential (primary) hypertension: Secondary | ICD-10-CM

## 2022-08-06 MED ORDER — AMLODIPINE BESYLATE 5 MG PO TABS *I*
5.0000 mg | ORAL_TABLET | Freq: Every day | ORAL | 0 refills | Status: DC
Start: 2022-08-06 — End: 2022-11-30

## 2022-08-06 MED ORDER — ATORVASTATIN CALCIUM 40 MG PO TABS *I*
40.0000 mg | ORAL_TABLET | Freq: Every day | ORAL | 3 refills | Status: DC
Start: 2022-08-06 — End: 2023-05-03

## 2022-08-27 ENCOUNTER — Encounter: Payer: Self-pay | Admitting: Family Medicine

## 2022-08-27 NOTE — Progress Notes (Signed)
08/27/2022 ROI Rosita Kea MD continuance of care received and put to PCP for completion and signature

## 2022-10-22 IMAGING — CR XR CHEST 2 VIEWS
1 series · 2 of 2 positions shown · non-contrast
Comparison: None

FINAL REPORT:
Chest radiograph 2 views
INDICATION: Acute cough
Shortness of breath

[Series 2660: PA · 2 of 2 slices shown]
[im 1/2]
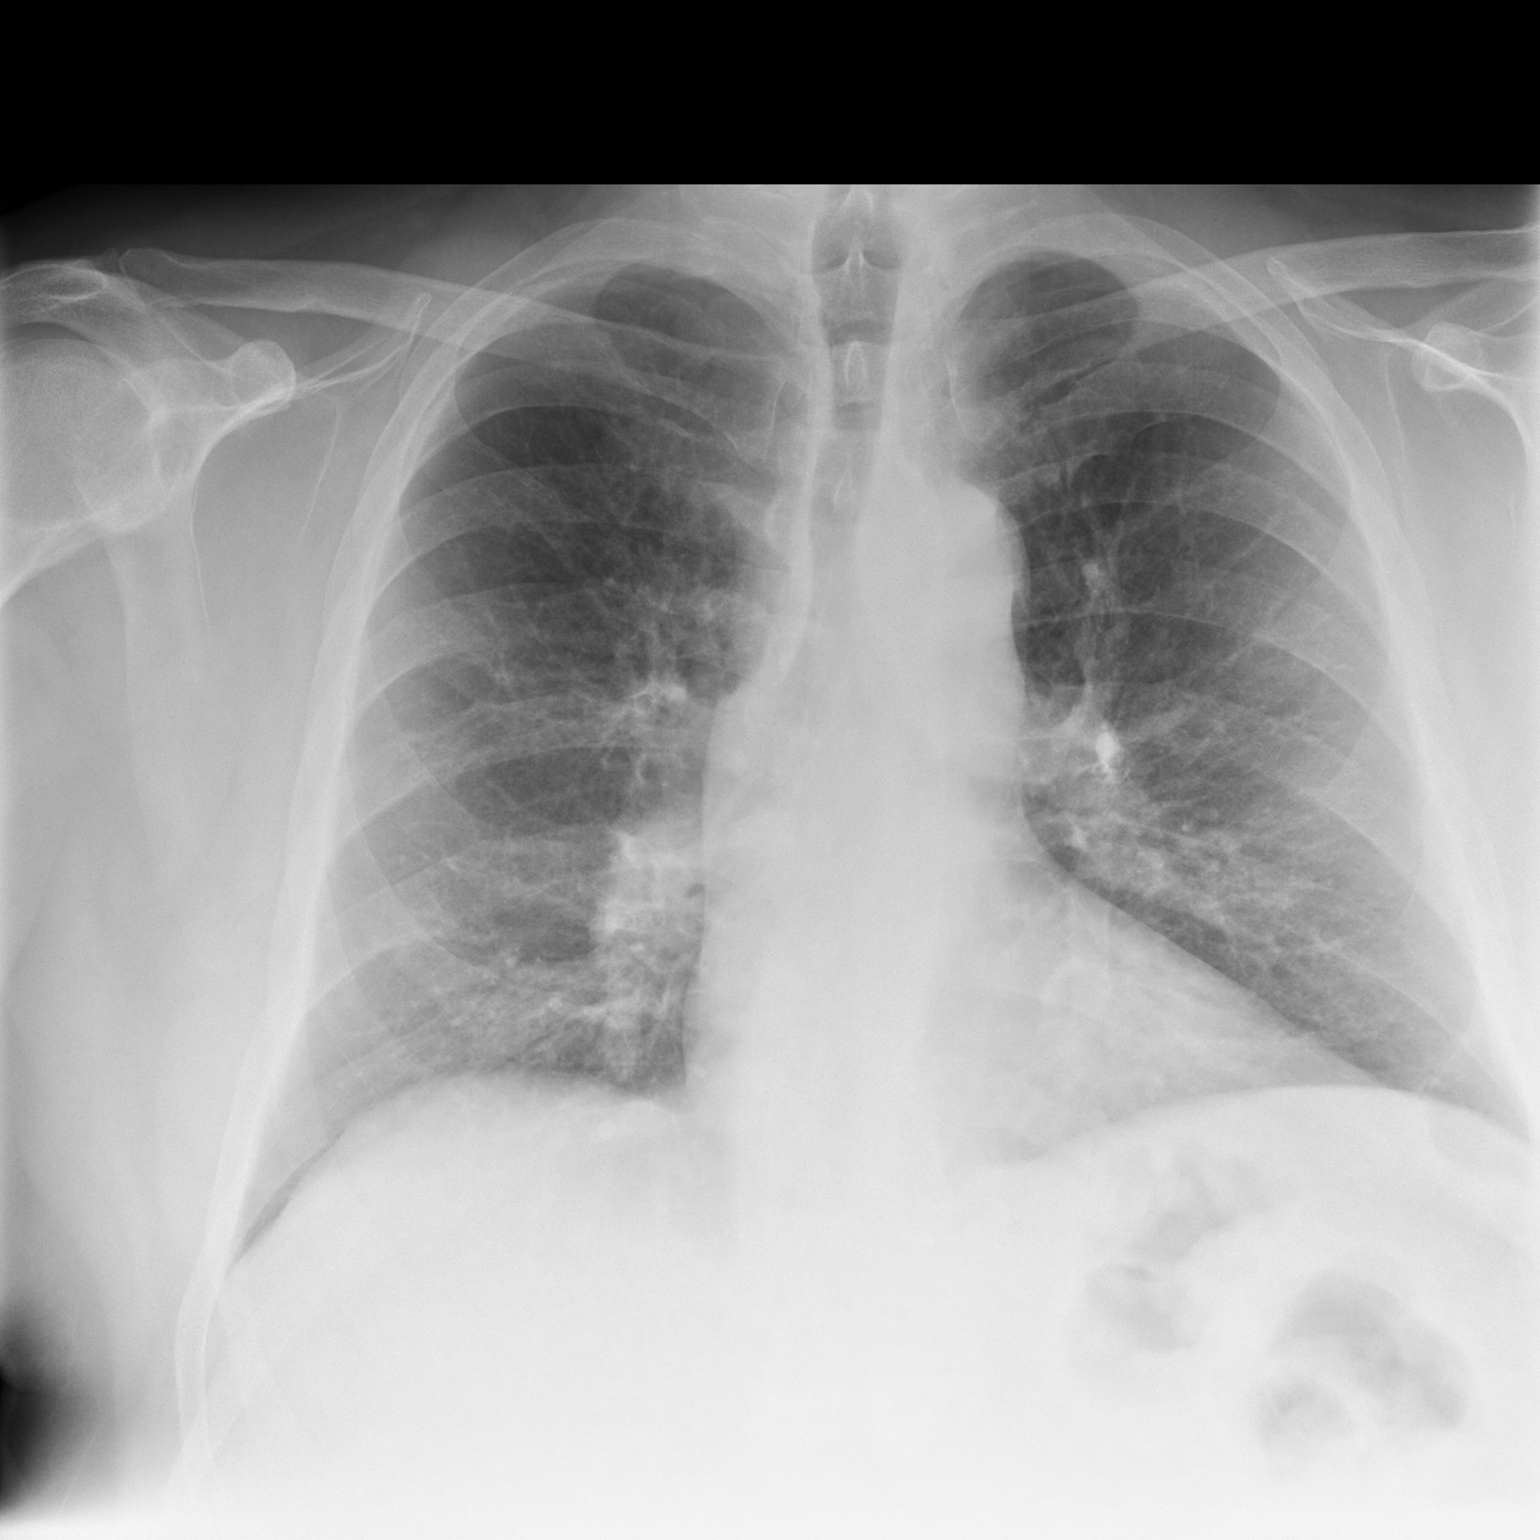
[im 2/2]
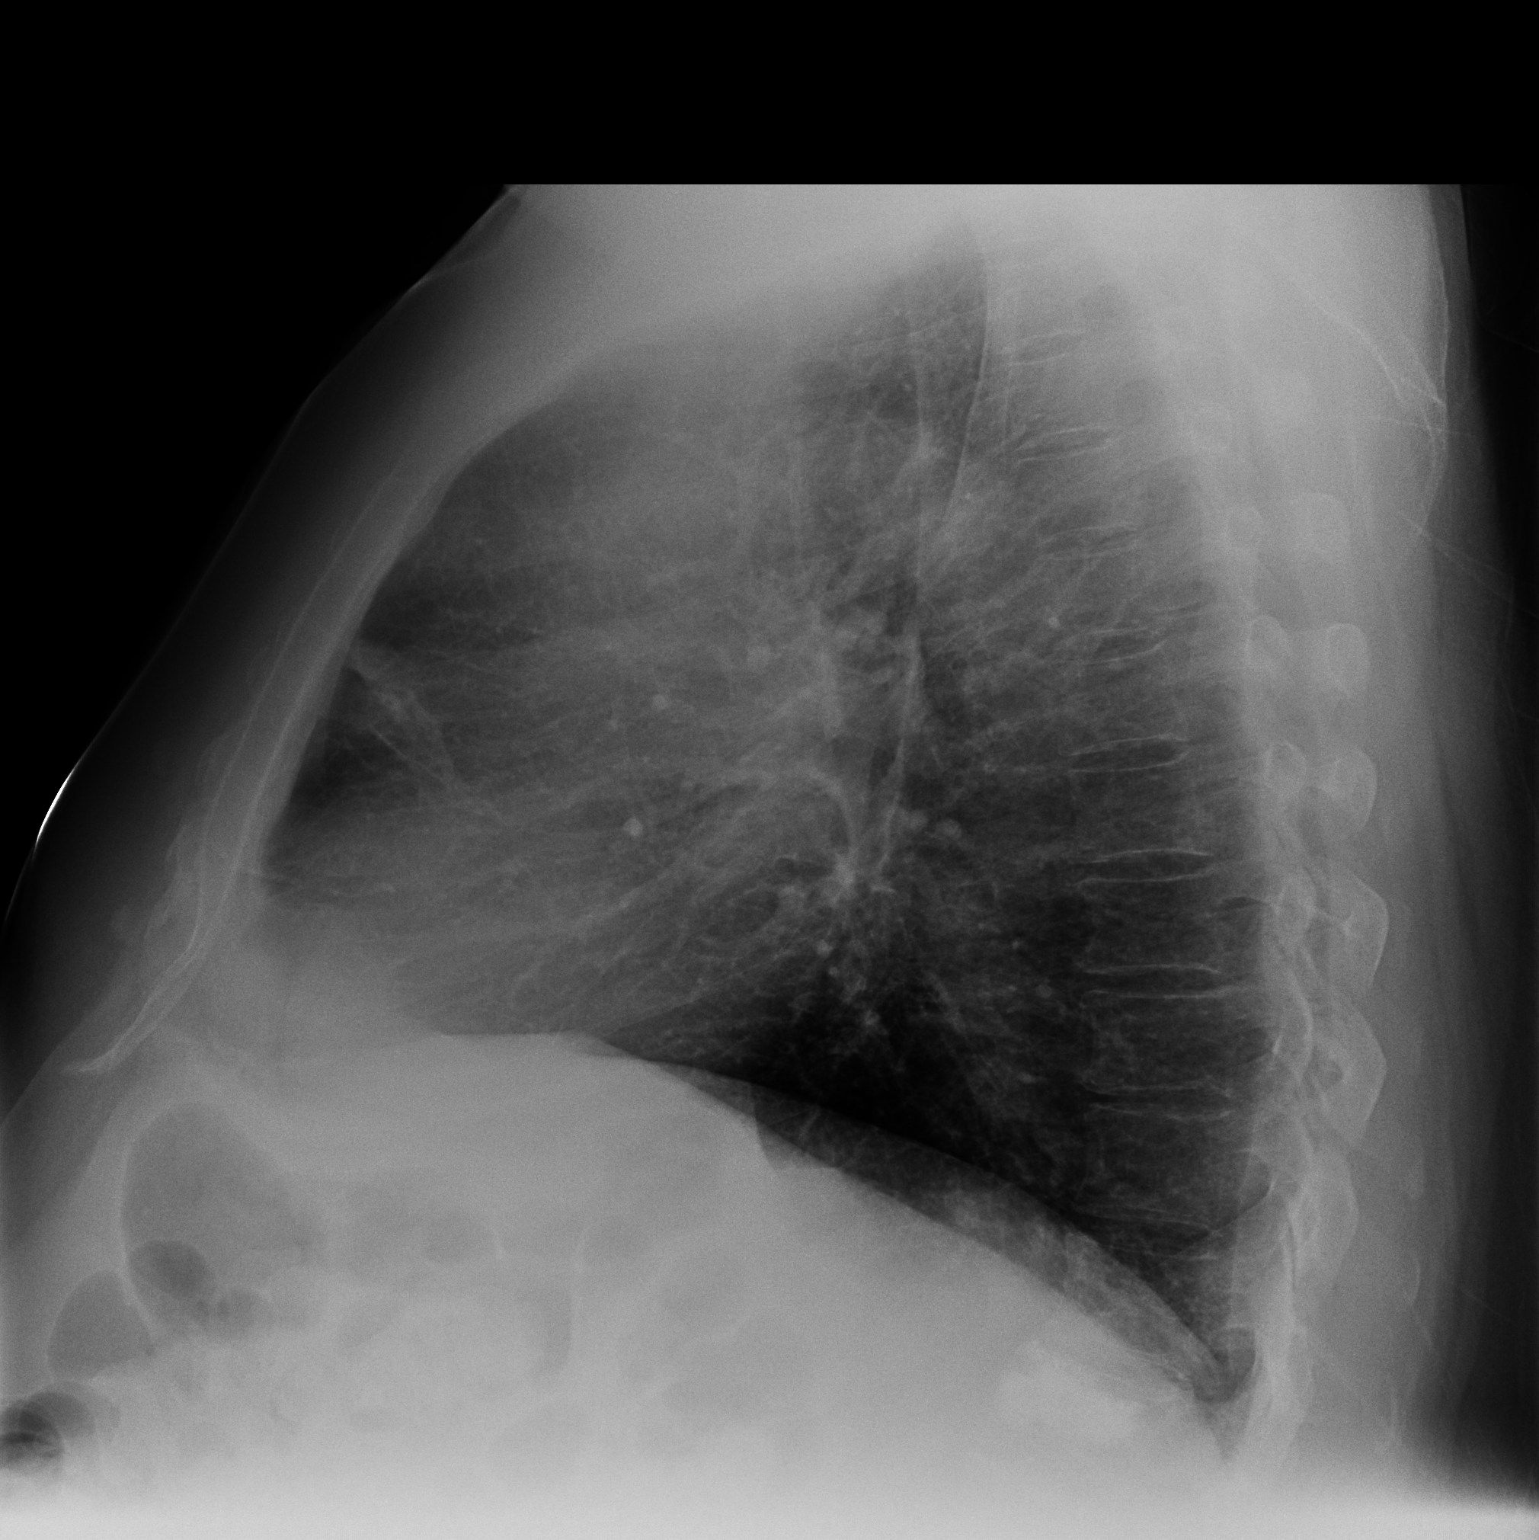

[2 of 2 positions shown; findings below may reference images not displayed]

FINDINGS: The cardiac silhouette is normal.
No focal consolidation. Right hilum appears full. No pneumothorax. No pleural effusion.
No acute osseous abnormalities. Vertebroplasty changes seen in the lower thoracic spine.
IMPRESSION: 
IMPRESSION: No acute cardiopulmonary process. There is fullness of the right hilum, likely due to overlying vessels though further catheterization with nonemergent chest CT is recommended.

## 2022-10-23 IMAGING — CT CT CHEST WITHOUT CONTRAST
4 series · 18 of 30 positions shown, 19 images · non-contrast
Comparison: Chest film from 10/22/2022.

R hilum fullnesss on CXR 10-22-22
FINAL REPORT:
CT chest without contrast
HISTORY: Abnormal findings on diagnostic imaging of other specified body structures  abnormal chest film.
TECHNIQUE: Images were obtained through the chest without contrast.

[Series 2: chest w/o · axial · non-contrast · 0.86mm/px · z∈[-98,-85]mm · 2 of 141 slices shown]
[im 71/141  lung]
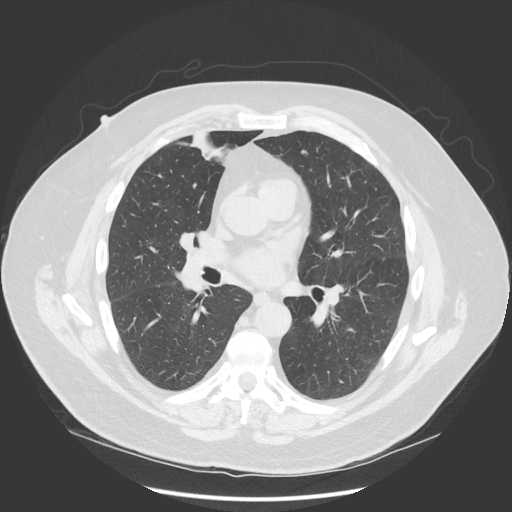
[im 76/141  lung]
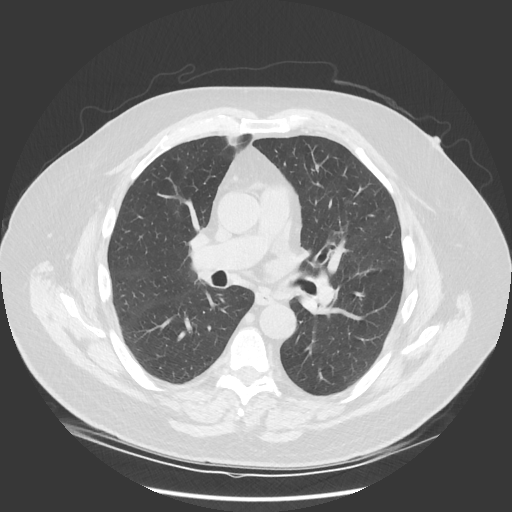

[Series 3: lung · axial · 0.86mm/px · z∈[-158,-40]mm · 3 of 141 slices shown, 4 images]
[im 47/141  mediastinal]
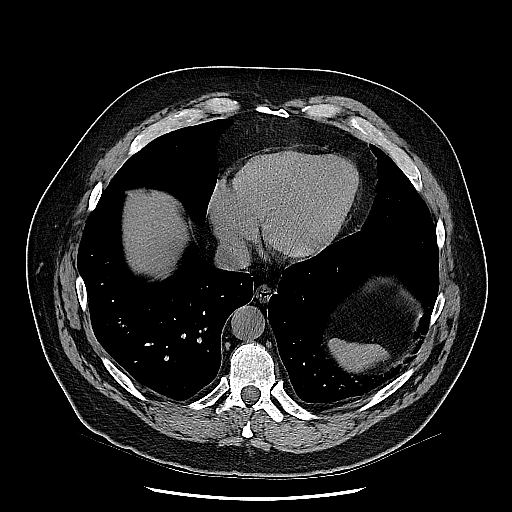
[im 47/141  lung]
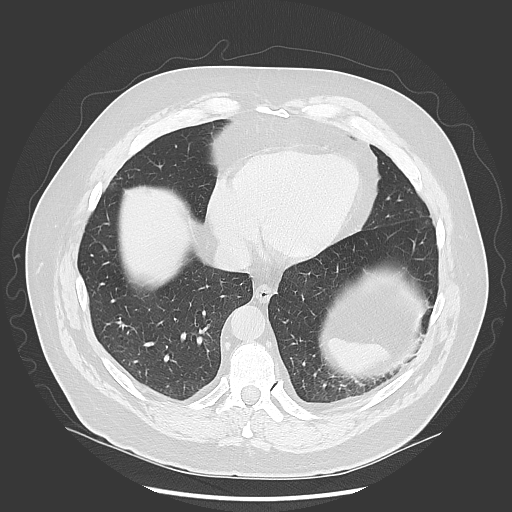
[im 76/141  lung]
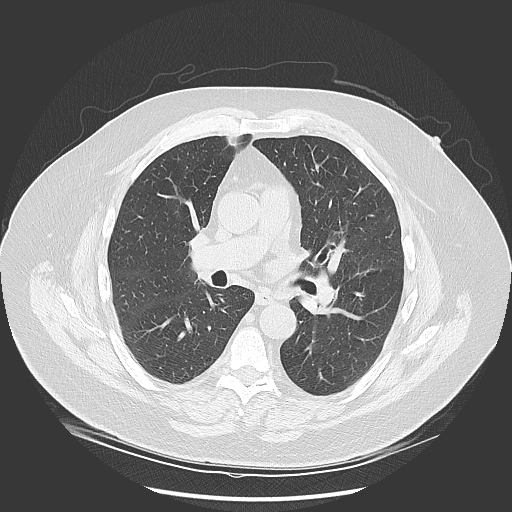
[im 94/141  lung]
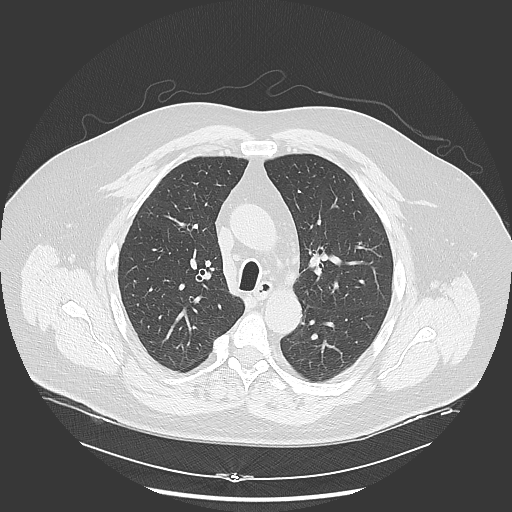

[Series 601: cor mips · coronal · 0.86mm/px · 8 of 394 slices shown]
[im 40/394  lung]
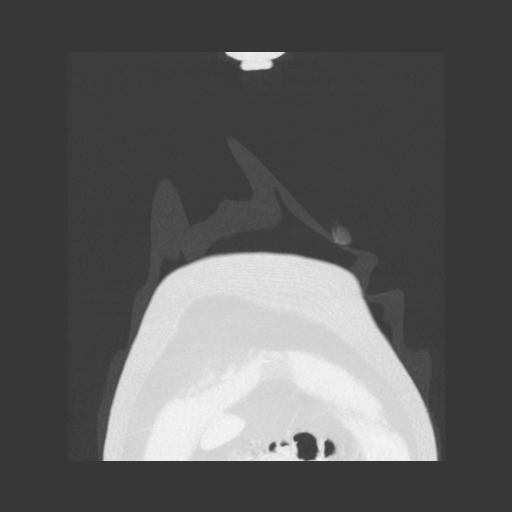
[im 79/394  lung]
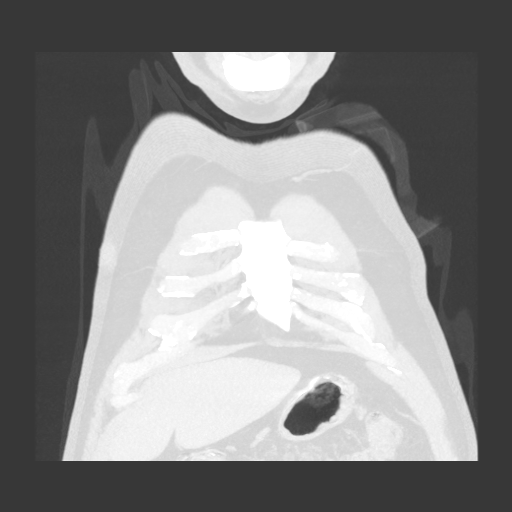
[im 118/394  lung]
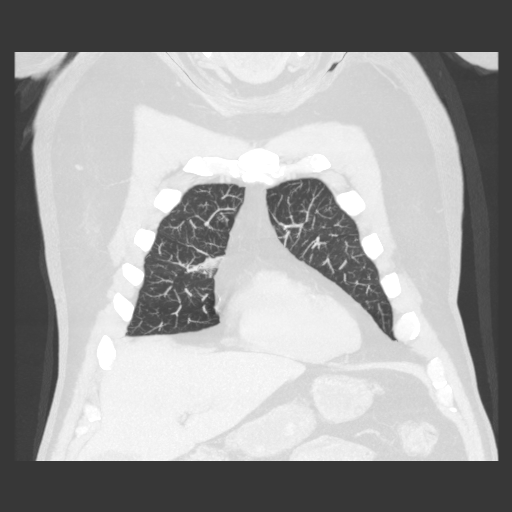
[im 158/394  lung]
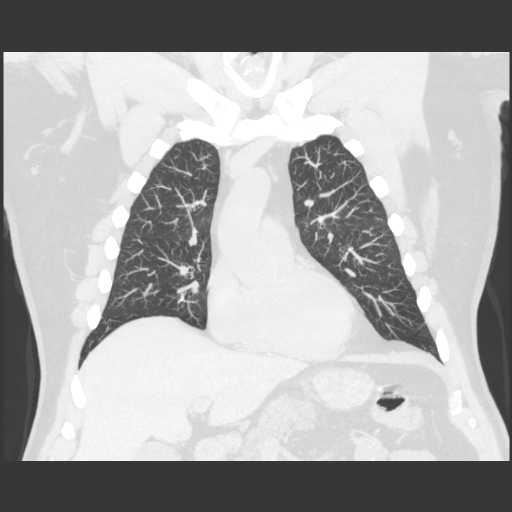
[im 236/394  lung]
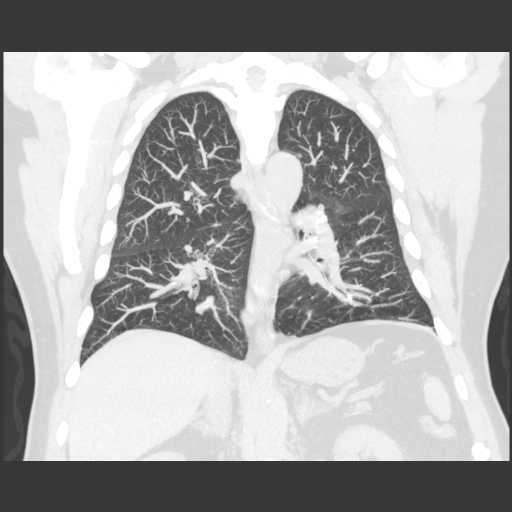
[im 276/394  lung]
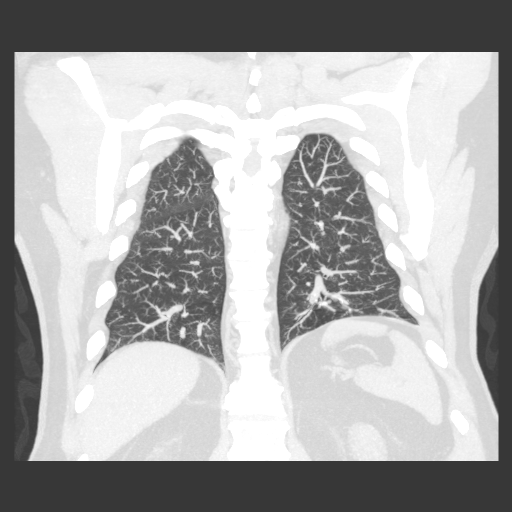
[im 315/394  lung]
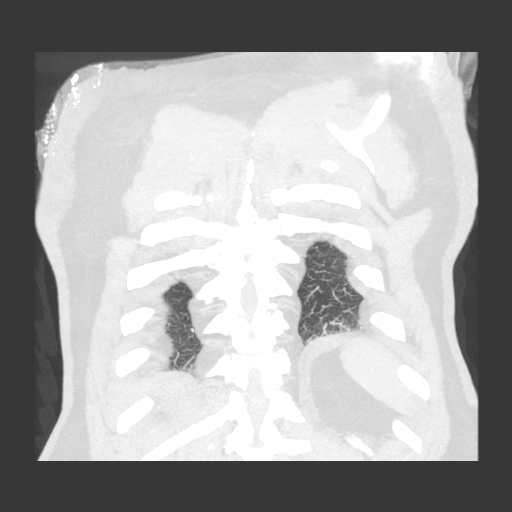
[im 354/394  lung]
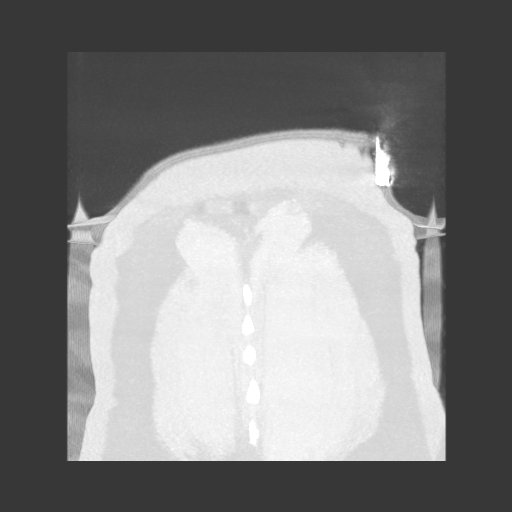

[Series 602: sag 1 · sagittal · 0.86mm/px · 5 of 352 slices shown]
[im 40/352  lung]
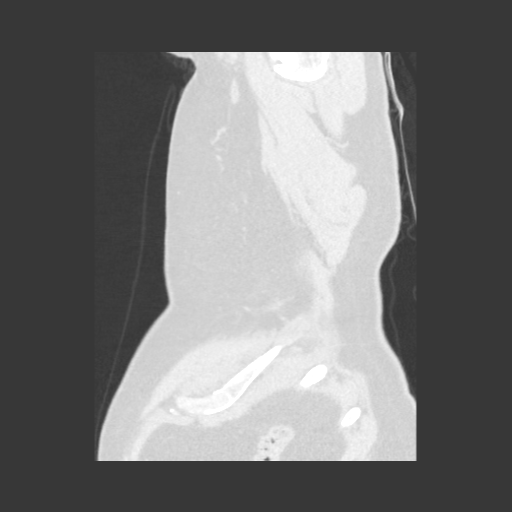
[im 79/352  lung]
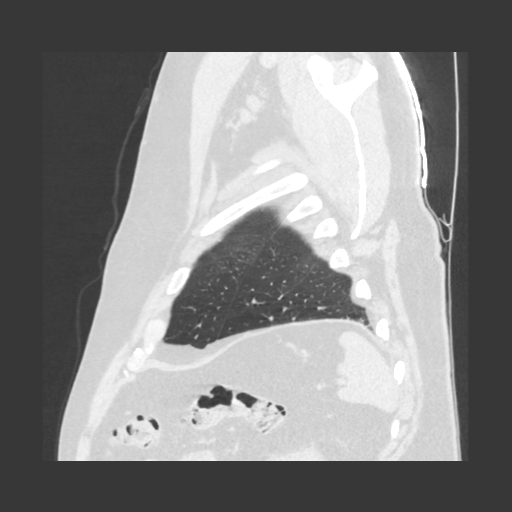
[im 118/352  lung]
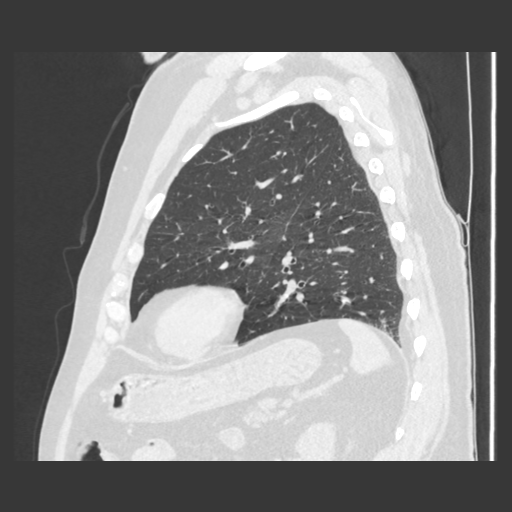
[im 157/352  lung]
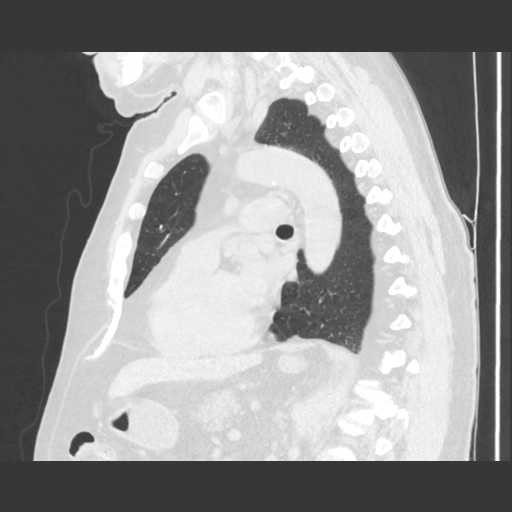
[im 196/352  lung]
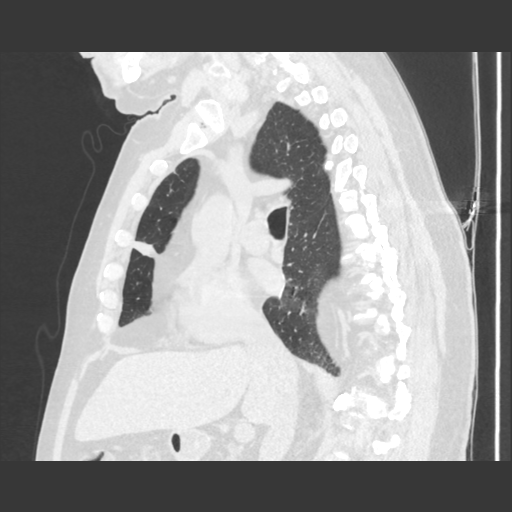

[18 of 30 positions shown; findings below may reference images not displayed]

FINDINGS: There is a small linear area of atelectasis and/or scarring in the right upper lobe. There is a faint 3 mm noncalcified nodule in the left lower lobe on image 73. Lungs are otherwise clear with no pulmonary consolidation or mass. No endobronchial mass identified.
No suspicious mediastinal or axillary adenopathy. No hilar adenopathy identified. Imaged portions of the liver and spleen are unremarkable. No aggressive osseous lesions identified.
IMPRESSION: 1. No acute pulmonary abnormality.
2. Faint 3 mm noncalcified nodule in the left lower lobe. If there is a clinically significant history of smoking or cancer, a CT of the chest without contrast can be performed in 12 months if follow-up is desired. Otherwise no further imaging follow-up is required per [address] guidelines.
All CT scans at this facility use iterative reconstruction technique, dose modulation and/or weight based dosing when appropriate to reduce radiation dose to as low as reasonably achievable.

## 2022-10-31 ENCOUNTER — Other Ambulatory Visit: Payer: Self-pay | Admitting: Primary Care

## 2022-10-31 DIAGNOSIS — K219 Gastro-esophageal reflux disease without esophagitis: Secondary | ICD-10-CM

## 2022-11-02 NOTE — Telephone Encounter (Signed)
Last appointment 05/05/22- has no future appointments with [place].

## 2022-11-28 ENCOUNTER — Other Ambulatory Visit: Payer: Self-pay | Admitting: Family Medicine

## 2022-11-28 DIAGNOSIS — I10 Essential (primary) hypertension: Secondary | ICD-10-CM

## 2023-05-01 ENCOUNTER — Other Ambulatory Visit: Payer: Self-pay | Admitting: Family Medicine

## 2023-05-01 DIAGNOSIS — E782 Mixed hyperlipidemia: Secondary | ICD-10-CM

## 2023-05-27 ENCOUNTER — Other Ambulatory Visit: Payer: Self-pay | Admitting: Family Medicine

## 2023-05-27 DIAGNOSIS — I10 Essential (primary) hypertension: Secondary | ICD-10-CM

## 2023-09-27 IMAGING — CT CT CHEST WITHOUT CONTRAST
3 of 6 series · 16 of 31 positions shown, 18 images · non-contrast
Comparison: Chest CT 10/23/2022.

LLL pulmonary nodule,
FINAL REPORT:
CT chest without contrast
CLINICAL HISTORY: Solitary pulmonary nodule, follow-up.
TECHNIQUE: CT evaluation of the chest was performed without IV contrast. Coronal and sagittal reformats were generated. All CT scans at this facility use dose modulation and/or weight based dosing when appropriate to reduce radiation dose to as low as reasonably achievable.

[Series 4: thins · axial · 0.90mm/px · z∈[-262,+4]mm · 8 of 567 slices shown, 10 images]
[im 71/567  mediastinal]
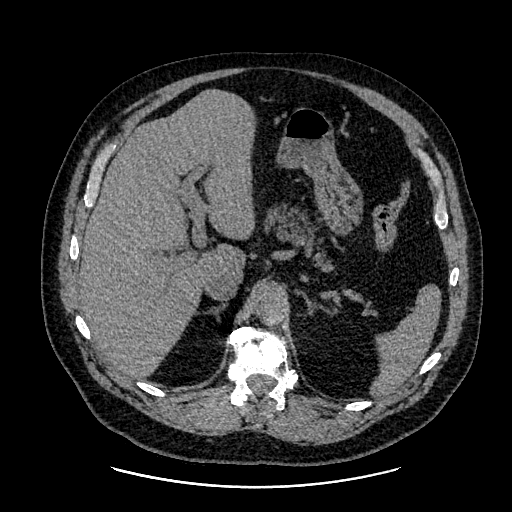
[im 71/567  lung]
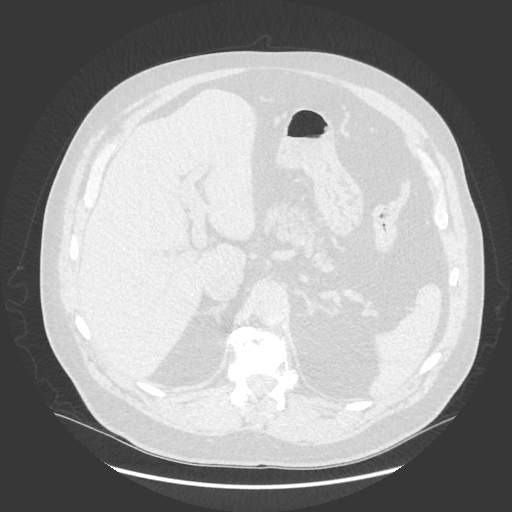
[im 142/567  lung]
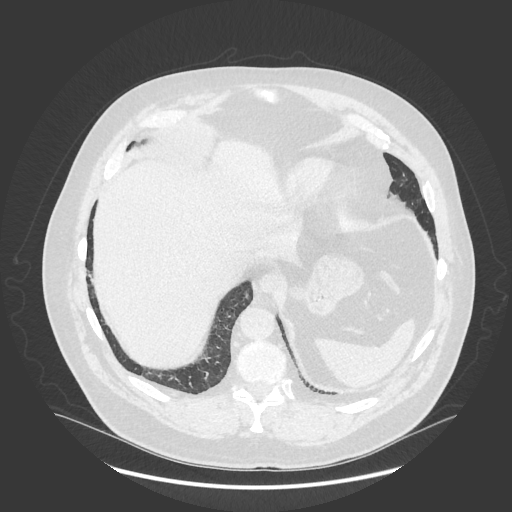
[im 213/567  lung]
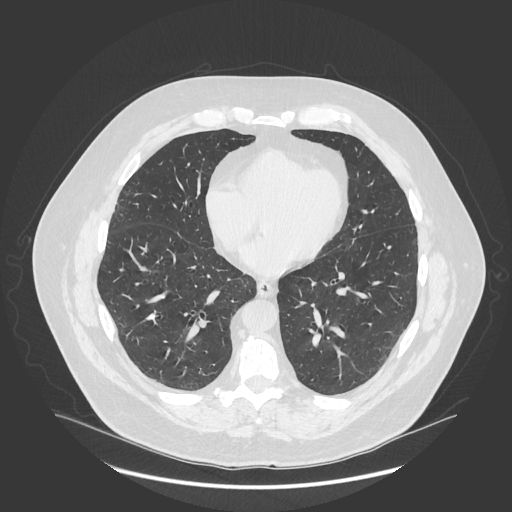
[im 284/567  lung]
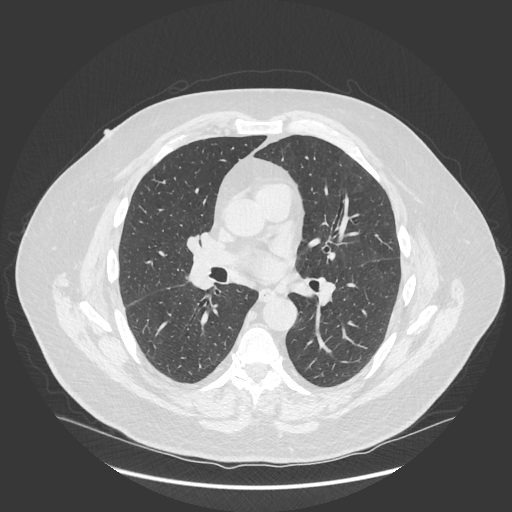
[im 311/567  mediastinal]
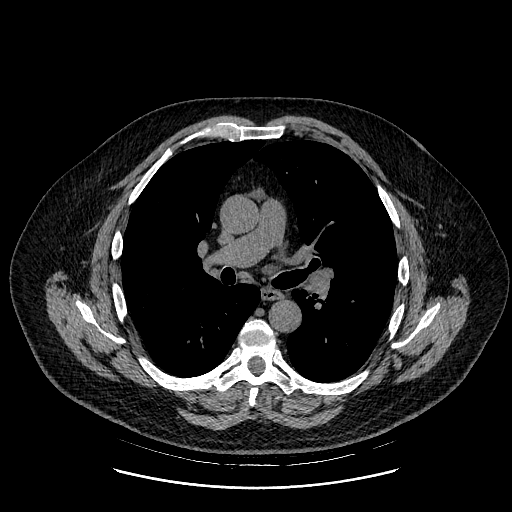
[im 311/567  lung]
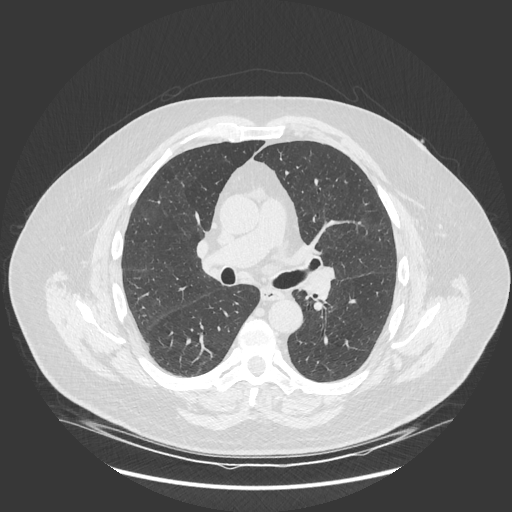
[im 354/567  lung]
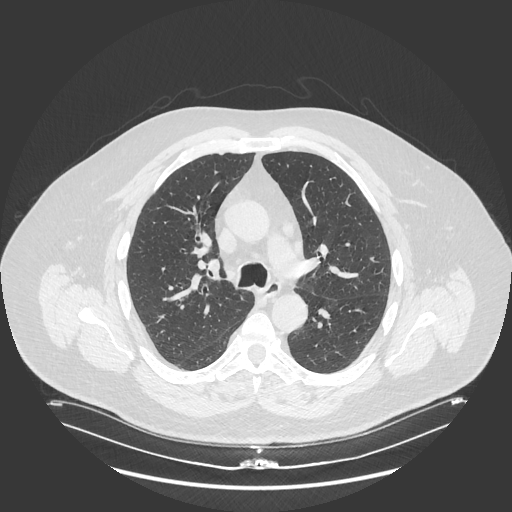
[im 425/567  lung]
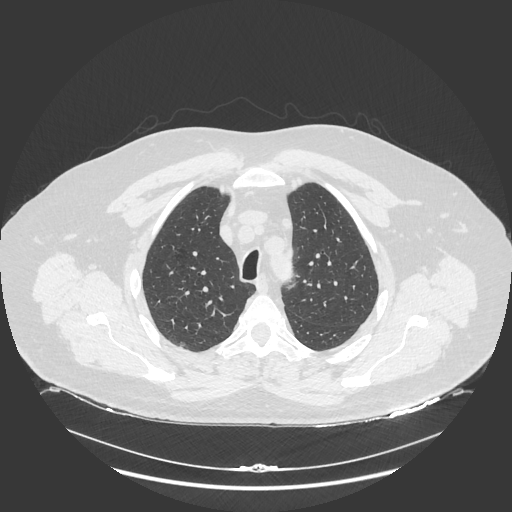
[im 496/567  lung]
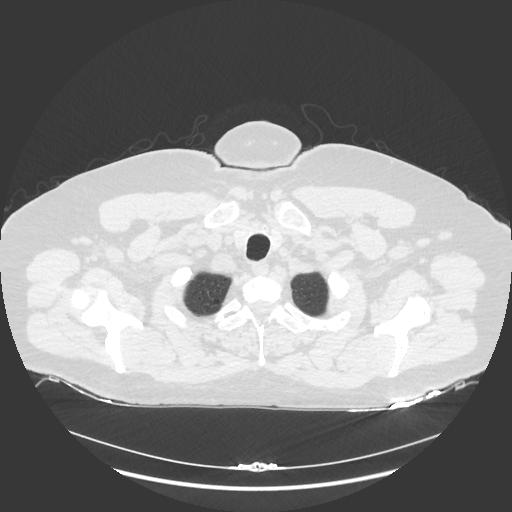

[Series 601: cor mips · coronal · 0.90mm/px · 4 of 397 slices shown]
[im 80/397  lung]
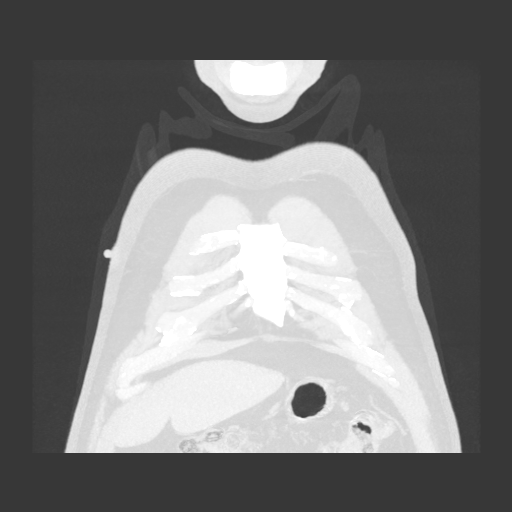
[im 159/397  lung]
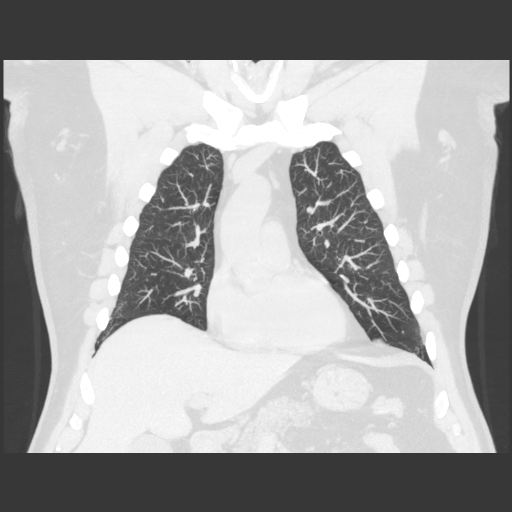
[im 238/397  lung]
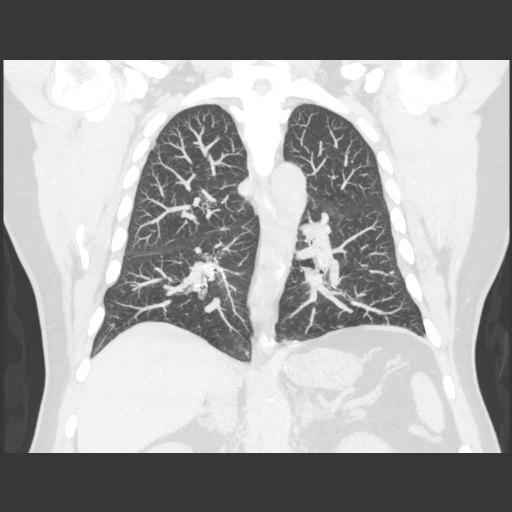
[im 317/397  lung]
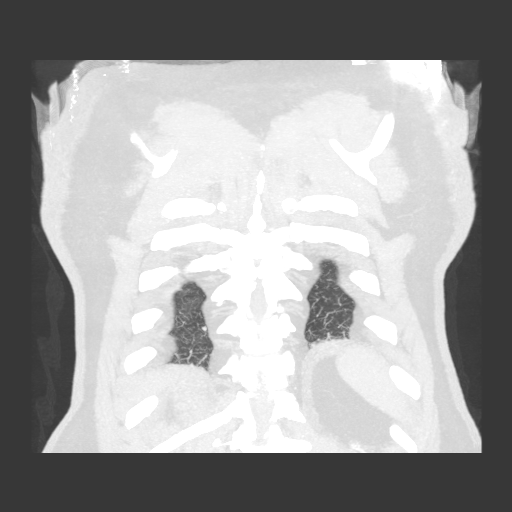

[Series 602: sag 1 · sagittal · 0.90mm/px · 4 of 370 slices shown]
[im 74/370  lung]
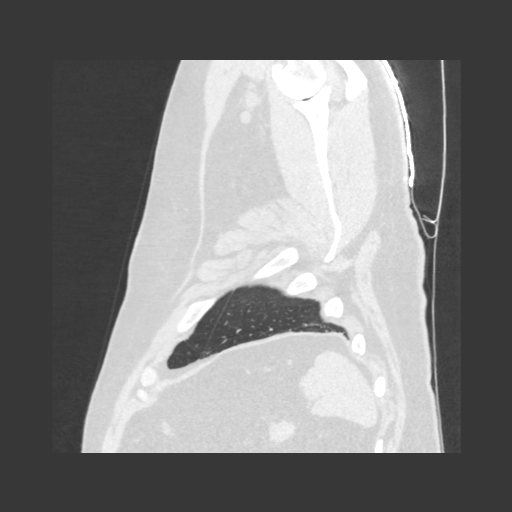
[im 148/370  lung]
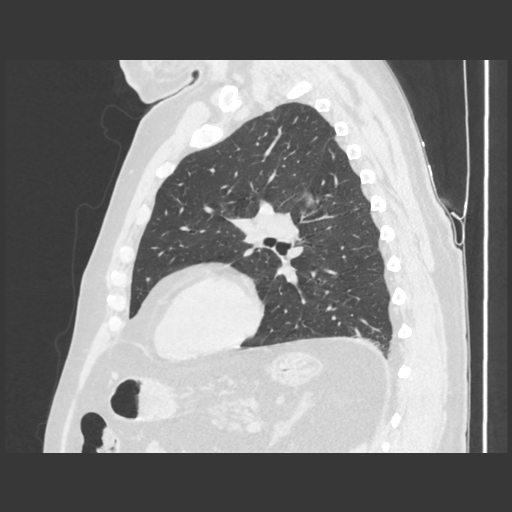
[im 222/370  lung]
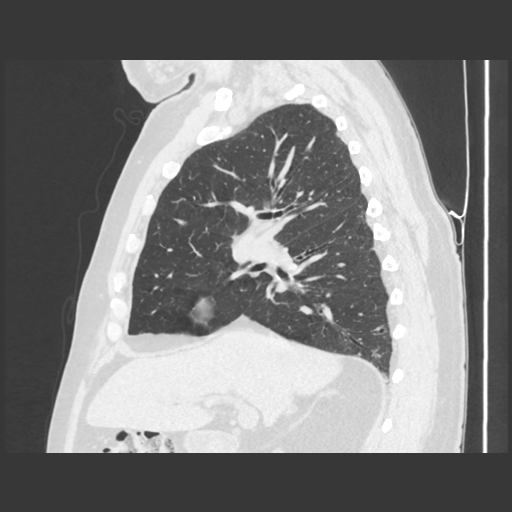
[im 296/370  lung]
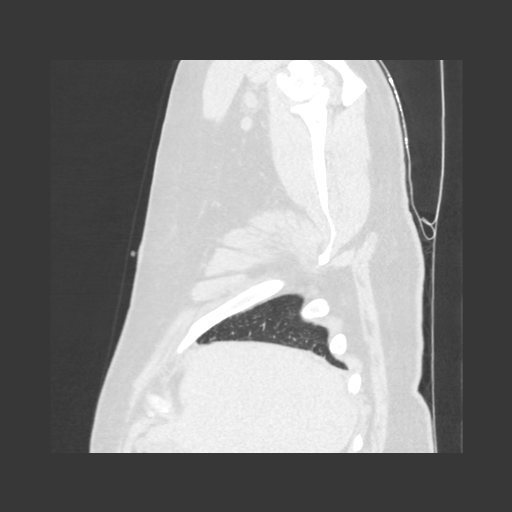

[16 of 31 positions shown; findings below may reference images not displayed]

FINDINGS: Evaluation of solid organs is limited without IV contrast.
CHEST:
CENTRAL AIRWAYS: Central tracheobronchial tree is patent.
LUNGS: Minimal emphysema. No focal consolidation. Stable 3 mm left lower lobe pulmonary nodule (series 4, image 307). No new or enlarging pulm nodule.
PLEURA: No pleural effusion. No pneumothorax.
HEART/MEDIASTINUM: Heart is normal in size without pericardial effusion. No significant coronary artery calcifications. Esophagus is grossly unremarkable.
VISUALIZED THYROID: Unremarkable.
LYMPH NODES: No lymphadenopathy.
CHEST WALL: Unremarkable.
BONES: Unchanged mild T12 vertebral compression fracture status post vertebroplasty.
UPPER ABDOMEN: Unremarkable
IMPRESSION: 
IMPRESSION: 1.  Stable 3 mm left lower lobe pulmonary nodule. No new or enlarging pulmonary nodule. Optional CT in 12 months can be considered to document 2 years of stability.

## 2023-11-03 ENCOUNTER — Other Ambulatory Visit: Payer: Self-pay | Admitting: Family Medicine

## 2023-11-03 DIAGNOSIS — K219 Gastro-esophageal reflux disease without esophagitis: Secondary | ICD-10-CM

## 2024-04-15 ENCOUNTER — Other Ambulatory Visit: Payer: Self-pay | Admitting: Family Medicine

## 2024-04-15 DIAGNOSIS — I10 Essential (primary) hypertension: Secondary | ICD-10-CM

## 2024-04-20 ENCOUNTER — Other Ambulatory Visit: Payer: Self-pay | Admitting: Family Medicine

## 2024-04-20 DIAGNOSIS — I10 Essential (primary) hypertension: Secondary | ICD-10-CM

## 2024-04-20 NOTE — Telephone Encounter (Signed)
 Pharmacy notified patient no longer at office and no PCP is listed on file.

## 2024-07-13 ENCOUNTER — Other Ambulatory Visit: Payer: Self-pay | Admitting: Family Medicine

## 2024-07-13 DIAGNOSIS — E782 Mixed hyperlipidemia: Secondary | ICD-10-CM

## 2024-08-06 IMAGING — MR MRI PELVIS WITH AND WITHOUT CONTRAST
16 series · 48 of 48 positions shown · non-contrast
Comparison: Scrotal ultrasound 06/30/2024

FINAL REPORT:
MRI PELVIS WITH AND WITHOUT CONTRAST
INDICATION: Right testicular lesion.
TECHNIQUE: Multiplanar multisequence MR imaging of the scrotum was performed with and without intravenous contrast.

[Series 2: survey · axial · 8.0mm · 0.88mm/px · z∈[-34,+239]mm · 2 of 19 slices shown]
[im 1/19]
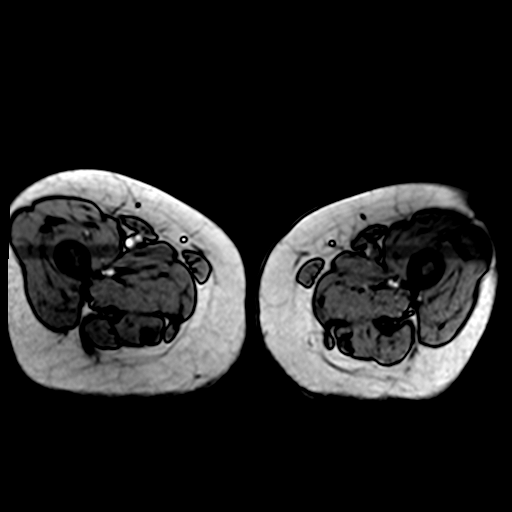
[im 19/19]
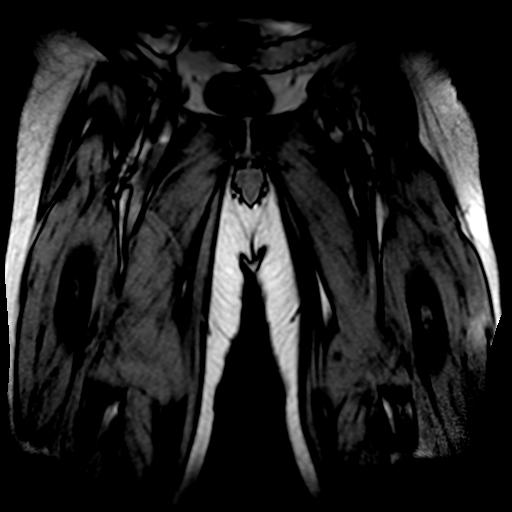

[Series 3: T2 fat-sat · axial · 6.0mm · 1.19mm/px · z∈[-76,+148]mm · 2 of 35 slices shown]
[im 1/35]
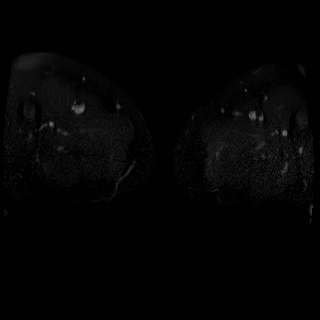
[im 35/35]
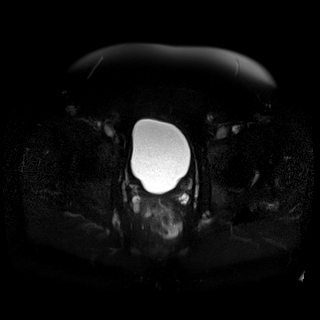

[Series 4: T2 · sagittal · 4.0mm · 0.62mm/px · 2 of 30 slices shown (1 of 5)]
[im 1/30]
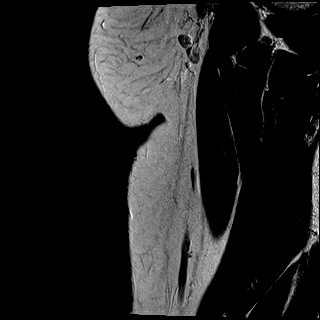
[im 30/30]
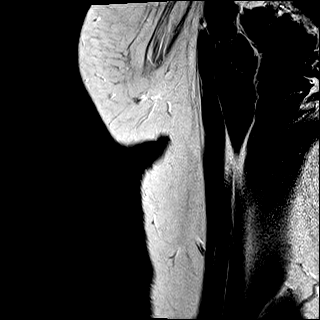

[Series 5: T2 · coronal · 4.0mm · 0.62mm/px · 2 of 30 slices shown (2 of 5)]
[im 1/30]
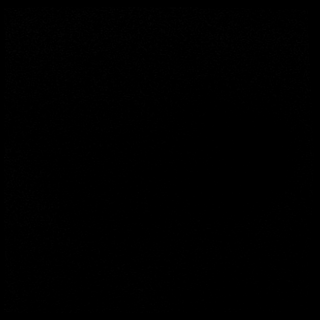
[im 30/30]
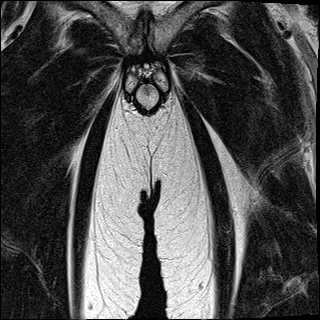

[Series 6: T2 · axial · 4.0mm · 0.62mm/px · z∈[-33,+83]mm · 2 of 30 slices shown (3 of 5)]
[im 1/30]
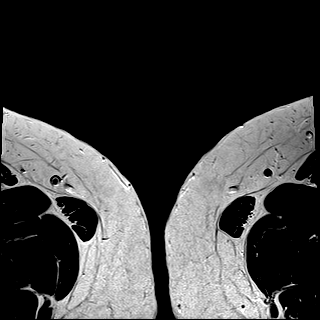
[im 30/30]
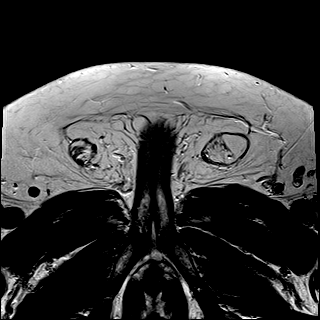

[Series 7: T1 fat-sat · axial · non-contrast · 4.0mm · 0.78mm/px · z∈[-33,+83]mm · 2 of 30 slices shown]
[im 1/30]
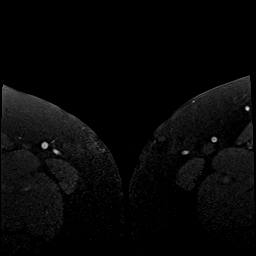
[im 30/30]
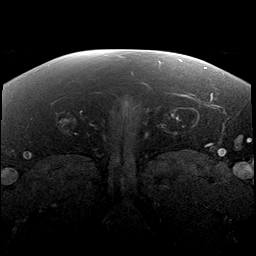

[Series 8: T1 · axial · non-contrast · 4.0mm · 0.78mm/px · z∈[-33,+83]mm · 2 of 30 slices shown]
[im 1/30]
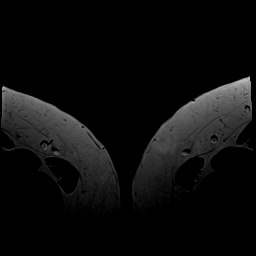
[im 30/30]
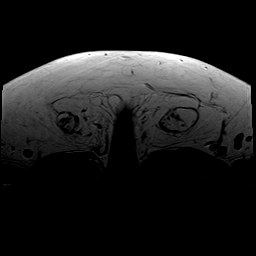

[Series 9: T1 dynamic · axial · 3.0mm · 1.19mm/px · z∈[-58,+179]mm · 5 of 80 slices shown (1 of 5)]
[im 1/80]
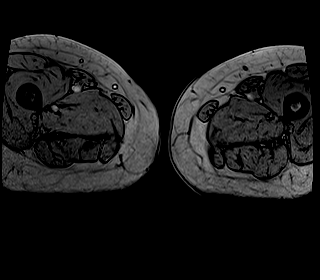
[im 20/80]
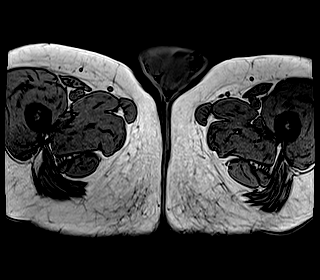
[im 40/80]
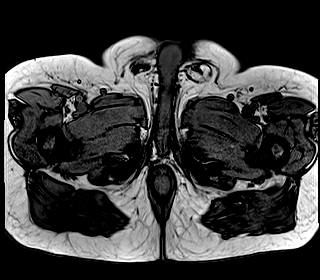
[im 60/80]
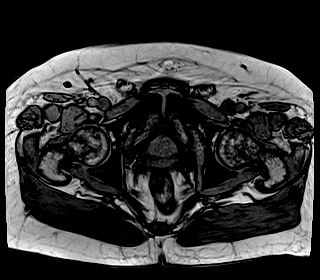
[im 80/80]
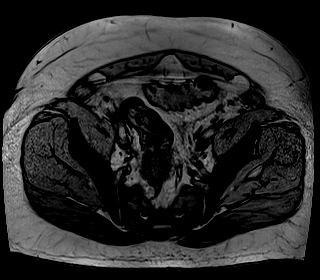

[Series 10: T1 dynamic · axial · 3.0mm · 1.19mm/px · z∈[-58,+179]mm · 5 of 80 slices shown (2 of 5)]
[im 1/80]
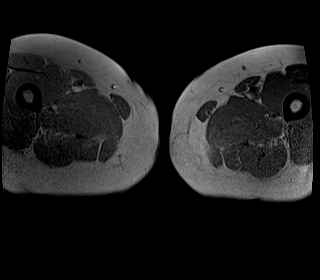
[im 20/80]
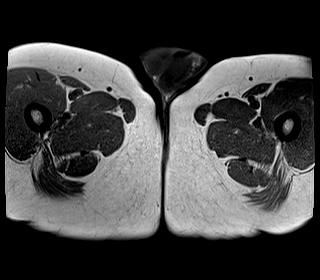
[im 40/80]
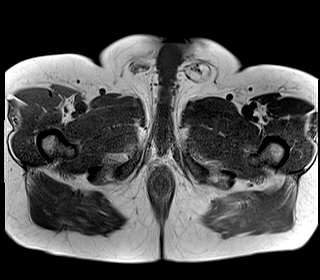
[im 60/80]
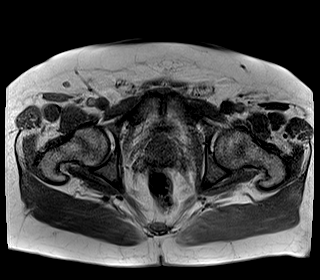
[im 80/80]
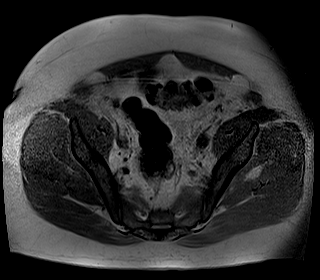

[Series 11: T1 dynamic · axial · 3.0mm · 1.19mm/px · z∈[-58,+179]mm · 5 of 80 slices shown (3 of 5)]
[im 1/80]
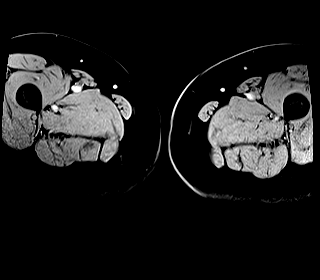
[im 20/80]
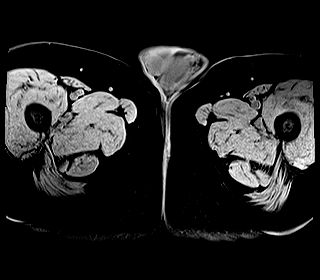
[im 40/80]
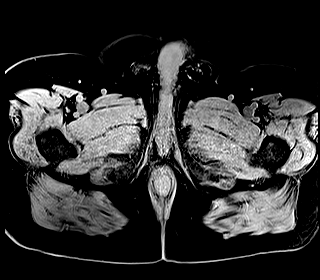
[im 60/80]
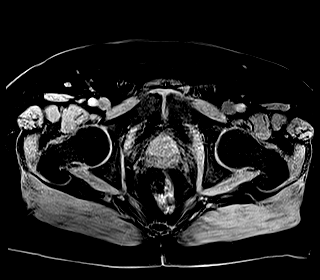
[im 80/80]
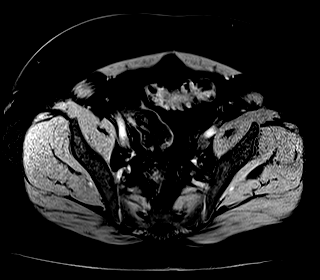

[Series 16: T1 dynamic · axial · 3.0mm · 1.19mm/px · z∈[-58,+179]mm · 5 of 80 slices shown (4 of 5)]
[im 1/80]
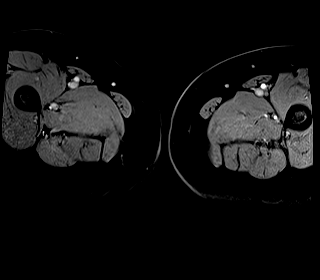
[im 20/80]
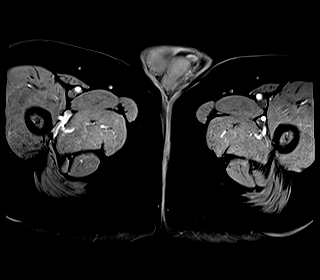
[im 40/80]
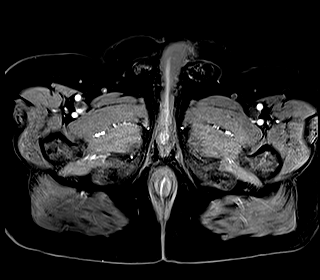
[im 60/80]
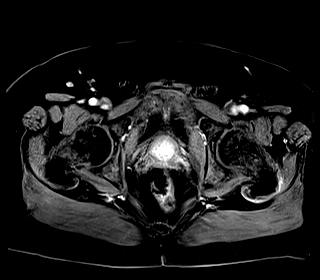
[im 80/80]
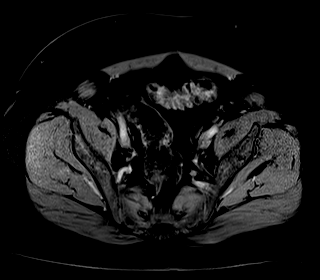

[Series 17: T1 dynamic · axial · 3.0mm · 1.19mm/px · z∈[-58,+179]mm · 5 of 80 slices shown (5 of 5)]
[im 1/80]
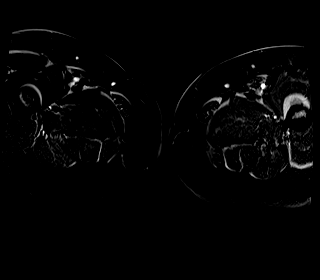
[im 20/80]
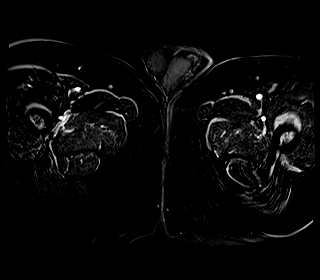
[im 40/80]
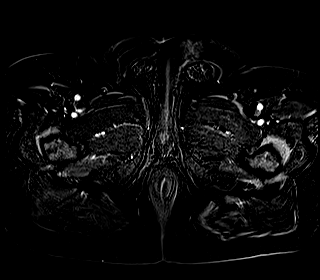
[im 60/80]
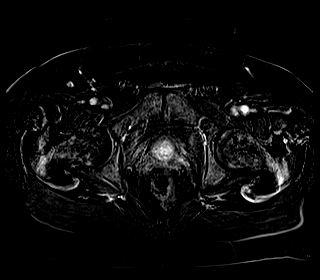
[im 80/80]
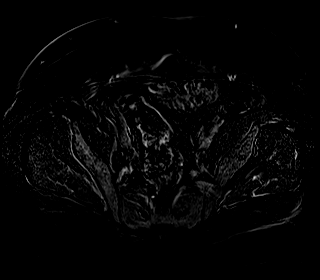

[Series 18: T1 fat-sat post-contrast · axial · 4.0mm · 0.78mm/px · z∈[-33,+83]mm · 2 of 30 slices shown (1 of 2)]
[im 1/30]
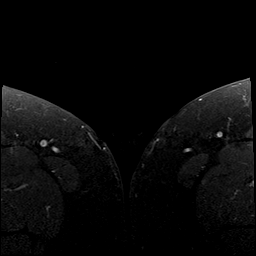
[im 30/30]
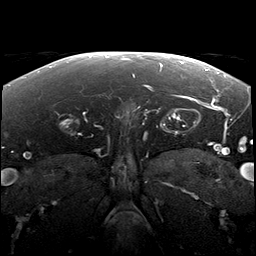

[Series 19: T2 · coronal · 6.0mm · 1.19mm/px · 2 of 35 slices shown (4 of 5)]
[im 1/35]
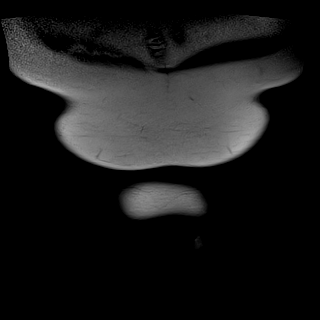
[im 35/35]
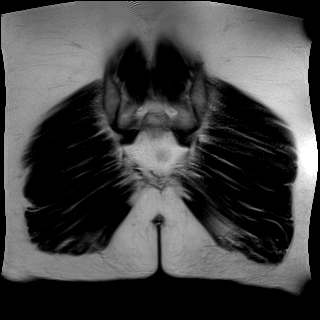

[Series 20: T1 fat-sat post-contrast · coronal · 4.0mm · 0.78mm/px · 2 of 30 slices shown (2 of 2)]
[im 1/30]
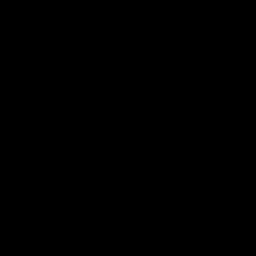
[im 30/30]
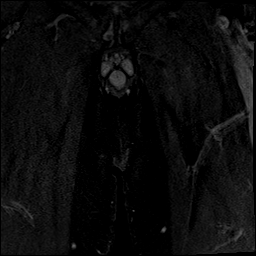

[Series 21: T2 · axial · 6.0mm · 1.19mm/px · z∈[-18,+239]mm · 3 of 40 slices shown (5 of 5)]
[im 1/40]
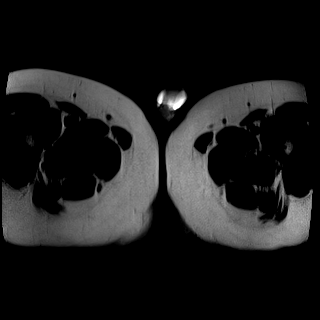
[im 20/40]
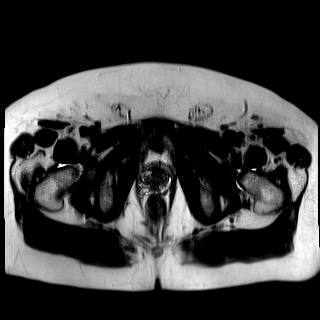
[im 40/40]
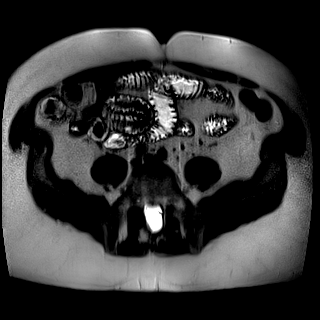

[48 of 48 positions shown; findings below may reference images not displayed]

FINDINGS: There is a nonenhancing cystic area in the posterior aspect of the right testicle measuring 1.2 x 1.0 x 2.7 cm (image 22 series 4) consistent with tubular ectasia of the rete testis there is an adjacent 5 mm epididymal cyst. Small right hydrocele. There is a small fat-containing right inguinal hernia.
The left testicle and epididymis are normal. Small left hydrocele. Small fat-containing left inguinal hernia.
No suspicious lymphadenopathy.
IMPRESSION: 
IMPRESSION: 1. No suspicious testicular lesions.
2. Small hydroceles.
